# Patient Record
Sex: Male | Born: 1950 | Race: Black or African American | Hispanic: No | Smoking: Former smoker
Health system: Southern US, Community
[De-identification: ages and names within clinical notes are randomized; demographics above are authoritative.]

## PROBLEM LIST (undated history)

## (undated) ENCOUNTER — Emergency Department (HOSPITAL_BASED_OUTPATIENT_CLINIC_OR_DEPARTMENT_OTHER): Admission: EM | Payer: Medicare Other | Source: Home / Self Care

## (undated) DIAGNOSIS — M1990 Unspecified osteoarthritis, unspecified site: Secondary | ICD-10-CM

## (undated) DIAGNOSIS — M109 Gout, unspecified: Secondary | ICD-10-CM

## (undated) DIAGNOSIS — G8929 Other chronic pain: Secondary | ICD-10-CM

## (undated) DIAGNOSIS — B019 Varicella without complication: Secondary | ICD-10-CM

## (undated) DIAGNOSIS — E669 Obesity, unspecified: Secondary | ICD-10-CM

## (undated) DIAGNOSIS — J309 Allergic rhinitis, unspecified: Secondary | ICD-10-CM

## (undated) DIAGNOSIS — I10 Essential (primary) hypertension: Secondary | ICD-10-CM

## (undated) DIAGNOSIS — Z87442 Personal history of urinary calculi: Secondary | ICD-10-CM

## (undated) HISTORY — DX: Gout, unspecified: M10.9

## (undated) HISTORY — DX: Personal history of urinary calculi: Z87.442

## (undated) HISTORY — DX: Allergic rhinitis, unspecified: J30.9

## (undated) HISTORY — DX: Essential (primary) hypertension: I10

## (undated) HISTORY — DX: Obesity, unspecified: E66.9

## (undated) HISTORY — PX: CARPAL TUNNEL RELEASE: SHX101

## (undated) HISTORY — DX: Unspecified osteoarthritis, unspecified site: M19.90

## (undated) HISTORY — DX: Varicella without complication: B01.9

---

## 2012-03-20 DIAGNOSIS — M109 Gout, unspecified: Secondary | ICD-10-CM | POA: Insufficient documentation

## 2012-03-20 DIAGNOSIS — I10 Essential (primary) hypertension: Secondary | ICD-10-CM | POA: Insufficient documentation

## 2014-06-20 DIAGNOSIS — E669 Obesity, unspecified: Secondary | ICD-10-CM | POA: Insufficient documentation

## 2014-06-20 DIAGNOSIS — Z87442 Personal history of urinary calculi: Secondary | ICD-10-CM | POA: Insufficient documentation

## 2014-06-20 DIAGNOSIS — J302 Other seasonal allergic rhinitis: Secondary | ICD-10-CM | POA: Insufficient documentation

## 2015-03-13 ENCOUNTER — Ambulatory Visit: Payer: Self-pay | Admitting: Family Medicine

## 2015-05-16 ENCOUNTER — Ambulatory Visit: Payer: Self-pay | Admitting: Family Medicine

## 2015-05-20 ENCOUNTER — Encounter: Payer: Self-pay | Admitting: Family Medicine

## 2015-05-20 ENCOUNTER — Ambulatory Visit (INDEPENDENT_AMBULATORY_CARE_PROVIDER_SITE_OTHER): Payer: Medicare Other | Admitting: Family Medicine

## 2015-05-20 ENCOUNTER — Telehealth: Payer: Self-pay | Admitting: Family Medicine

## 2015-05-20 VITALS — BP 142/84 | HR 82 | Temp 98.4°F | Ht 72.25 in | Wt 235.0 lb

## 2015-05-20 DIAGNOSIS — I10 Essential (primary) hypertension: Secondary | ICD-10-CM | POA: Diagnosis not present

## 2015-05-20 DIAGNOSIS — M25561 Pain in right knee: Secondary | ICD-10-CM

## 2015-05-20 DIAGNOSIS — Z0000 Encounter for general adult medical examination without abnormal findings: Secondary | ICD-10-CM | POA: Diagnosis not present

## 2015-05-20 DIAGNOSIS — Z13220 Encounter for screening for lipoid disorders: Secondary | ICD-10-CM | POA: Diagnosis not present

## 2015-05-20 DIAGNOSIS — M25562 Pain in left knee: Secondary | ICD-10-CM

## 2015-05-20 DIAGNOSIS — M1710 Unilateral primary osteoarthritis, unspecified knee: Secondary | ICD-10-CM | POA: Insufficient documentation

## 2015-05-20 LAB — COMPREHENSIVE METABOLIC PANEL
ALK PHOS: 133 U/L — AB (ref 39–117)
ALT: 38 U/L (ref 0–53)
AST: 61 U/L — AB (ref 0–37)
Albumin: 3.9 g/dL (ref 3.5–5.2)
BILIRUBIN TOTAL: 0.7 mg/dL (ref 0.2–1.2)
BUN: 13 mg/dL (ref 6–23)
CO2: 24 mEq/L (ref 19–32)
Calcium: 9.6 mg/dL (ref 8.4–10.5)
Chloride: 102 mEq/L (ref 96–112)
Creatinine, Ser: 1.33 mg/dL (ref 0.40–1.50)
GFR: 69.31 mL/min (ref >60.00)
GLUCOSE: 97 mg/dL (ref 70–99)
Potassium: 4.6 mEq/L (ref 3.5–5.1)
SODIUM: 134 meq/L — AB (ref 135–145)
TOTAL PROTEIN: 7.7 g/dL (ref 6.0–8.3)

## 2015-05-20 LAB — LIPID PANEL
Cholesterol: 172 mg/dL (ref 0–200)
HDL: 65.6 mg/dL (ref >39.00)
LDL Cholesterol: 78 mg/dL (ref 0–99)
NONHDL: 106.75
Total CHOL/HDL Ratio: 3
Triglycerides: 144 mg/dL (ref 0.0–149.0)
VLDL: 28.8 mg/dL (ref 0.0–40.0)

## 2015-05-20 NOTE — Assessment & Plan Note (Signed)
Declines immunizations. Is in need of colonoscopy and AAA screening. He would like to wait on this at this time. Declines PSA screening. Attending labs today: Lipid and CMP.

## 2015-05-20 NOTE — Telephone Encounter (Signed)
As been added to his chart.

## 2015-05-20 NOTE — Telephone Encounter (Signed)
Pt called back to give the pharmacy is uses, [facility] [address] [id number], [place] [address] [phone]. Thank you!

## 2015-05-20 NOTE — Progress Notes (Signed)
Subjective:  Patient ID: Brian Hawkins, male    DOB: 12/21/1950  Age: 65 y.o. MRN: [id number]  CC: Establish care; Knee pain, bilateral  HPI Brian Hawkins is a 65 y.o. male presents to the clinic today to establish care.  Preventative Healthcare  Colonoscopy: Wants to wait on this.  Immunizations  Tetanus - Declines.  Pneumococcal - Declines.   Flu - Declines.   Zoster - Declines.   Prostate cancer screening: Has had screening previously. Declines at this time.  Labs: Needs Metabolic panel and Lipid panel today .  Exercise: No.   Alcohol use: See below.   Smoking/tobacco use: Former smoker. Needs AAA screening; patient elects to wait.  Regular dental exams: Dentures.  Knee pain, bilateral  Patient reports long-standing knee pain bilaterally, right greater than left.  He states is worse for the past 2 weeks after he took someone bowling.  Pain is located diffusely.  He's been taking Aleve with little improvement.  Exacerbated by physical activity.  No other complaints.  PMH, Surgical Hx, Family Hx, Social History reviewed and updated as below.  Past Medical History  Diagnosis Date  . Hypertension   . Obesity   . Gout   . History of kidney stones   . Allergic rhinitis   . Chicken pox   . Arthritis    Past Surgical History  Procedure Laterality Date  . Carpal tunnel release      Left    Family History  Problem Relation Age of Onset  . Hypertension Mother   . Hypertension Father   . Arthritis Father   . Hypertension Sister   . Hypertension Brother   . Hypertension Maternal Aunt   . Hypertension Maternal Uncle   . Hypertension Paternal Aunt   . Hypertension Paternal Uncle   . Hypertension Maternal Grandmother   . Hypertension Maternal Grandfather   . Hypertension Paternal Grandmother   . Hypertension Paternal Grandfather   . Hypertension Sister   . Hypertension Sister   . Hypertension Brother     Social History  Substance Use Topics  .  Smoking status: Former Smoker    Quit date: 05/19/2006  . Smokeless tobacco: Never Used  . Alcohol Use: 9.0 oz/week    0 Standard drinks or equivalent, 15 Glasses of wine per week   Review of Systems  Musculoskeletal:       Knee pain, bilateral.   Neurological:       Memory problems.  All other systems reviewed and are negative.  Objective:   Today's Vitals: BP 142/84 mmHg  Pulse 82  Temp(Src) 98.4 F (36.9 C) (Oral)  Ht 6' 0.25" (1.835 m)  Wt 235 lb (106.595 kg)  BMI 31.66 kg/m2  SpO2 96%  Physical Exam  Constitutional: He is oriented to person, place, and time. He appears well-developed. No distress.  HENT:  Head: Normocephalic and atraumatic.  Mouth/Throat: Oropharynx is clear and moist. No oropharyngeal exudate.  Normal TM's bilaterally.   Eyes: Conjunctivae are normal. No scleral icterus.  Neck: Neck supple.  Cardiovascular: Normal rate and regular rhythm.   2/6 systolic murmur noted.   Pulmonary/Chest: Effort normal and breath sounds normal. He has no wheezes. He has no rales.  Abdominal: Soft. He exhibits no distension. There is no tenderness. There is no rebound and no guarding.  Lymphadenopathy:    He has no cervical adenopathy.  Neurological: He is alert and oriented to person, place, and time.  Skin: Skin is warm and dry. No rash noted.  Psychiatric: He has a normal mood and affect.  Vitals reviewed.  Assessment & Plan:   Problem List Items Addressed This Visit    Essential hypertension    At goal given age. Will need labs prior to refilling medication as last BMP reveal elevated creatinine.        Relevant Medications   amLODipine-valsartan (EXFORGE) 10-320 MG tablet   Other Relevant Orders   Comp Met (CMET)   Preventative health care - Primary    Declines immunizations. Is in need of colonoscopy and AAA screening. He would like to wait on this at this time. Declines PSA screening. Attending labs today: Lipid and CMP.      Bilateral knee pain     Secondary to osteoarthritis. Advised xray and patient declined. Recommended PRN Tylenol with occasional NSAID (need to review renal function).       Other Visit Diagnoses    Screening, lipid        Relevant Orders    Lipid Profile       Outpatient Encounter Prescriptions as of 05/20/2015  Medication Sig  . amLODipine-valsartan (EXFORGE) 10-320 MG tablet Take by mouth.   No facility-administered encounter medications on file as of 05/20/2015.    Follow-up: Return in about 6 months (around 11/17/2015).  [place]

## 2015-05-20 NOTE — Patient Instructions (Addendum)
It was nice to see you today.  We will call with your lab results.  Continue your medication.  Use tylenol and/or aleve (sparingly for your knee pain).  Follow up:  Return in about 6 months (around 11/17/2015).  Take care  Dr. Adriana Simas  Health Maintenance, Male A healthy lifestyle and preventative care can promote health and wellness.  Maintain regular health, dental, and eye exams.  Eat a healthy diet. Foods like vegetables, fruits, whole grains, low-fat dairy products, and lean protein foods contain the nutrients you need and are low in calories. Decrease your intake of foods high in solid fats, added sugars, and salt. Get information about a proper diet from your health care provider, if necessary.  Regular physical exercise is one of the most important things you can do for your health. Most adults should get at least 150 minutes of moderate-intensity exercise (any activity that increases your heart rate and causes you to sweat) each week. In addition, most adults need muscle-strengthening exercises on 2 or more days a week.   Maintain a healthy weight. The body mass index (BMI) is a screening tool to identify possible weight problems. It provides an estimate of body fat based on height and weight. Your health care provider can find your BMI and can help you achieve or maintain a healthy weight. For males 20 years and older:  A BMI below 18.5 is considered underweight.  A BMI of 18.5 to 24.9 is normal.  A BMI of 25 to 29.9 is considered overweight.  A BMI of 30 and above is considered obese.  Maintain normal blood lipids and cholesterol by exercising and minimizing your intake of saturated fat. Eat a balanced diet with plenty of fruits and vegetables. Blood tests for lipids and cholesterol should begin at age 38 and be repeated every 5 years. If your lipid or cholesterol levels are high, you are over age 33, or you are at high risk for heart disease, you may need your cholesterol  levels checked more frequently.Ongoing high lipid and cholesterol levels should be treated with medicines if diet and exercise are not working.  If you smoke, find out from your health care provider how to quit. If you do not use tobacco, do not start.  Lung cancer screening is recommended for adults aged 55-80 years who are at high risk for developing lung cancer because of a history of smoking. A yearly low-dose CT scan of the lungs is recommended for people who have at least a 30-pack-year history of smoking and are current smokers or have quit within the past 15 years. A pack year of smoking is smoking an average of 1 pack of cigarettes a day for 1 year (for example, a 30-pack-year history of smoking could mean smoking 1 pack a day for 30 years or 2 packs a day for 15 years). Yearly screening should continue until the smoker has stopped smoking for at least 15 years. Yearly screening should be stopped for people who develop a health problem that would prevent them from having lung cancer treatment.  If you choose to drink alcohol, do not have more than 2 drinks per day. One drink is considered to be 12 oz (360 mL) of beer, 5 oz (150 mL) of wine, or 1.5 oz (45 mL) of liquor.  Avoid the use of street drugs. Do not share needles with anyone. Ask for help if you need support or instructions about stopping the use of drugs.  High blood pressure causes  heart disease and increases the risk of stroke. High blood pressure is more likely to develop in:  People who have blood pressure in the end of the normal range (100-139/85-89 mm Hg).  People who are overweight or obese.  People who are African American.  If you are 1-57 years of age, have your blood pressure checked every 3-5 years. If you are 18 years of age or older, have your blood pressure checked every year. You should have your blood pressure measured twice--once when you are at a hospital or clinic, and once when you are not at a hospital or  clinic. Record the average of the two measurements. To check your blood pressure when you are not at a hospital or clinic, you can use:  An automated blood pressure machine at a pharmacy.  A home blood pressure monitor.  If you are 2-23 years old, ask your health care provider if you should take aspirin to prevent heart disease.  Diabetes screening involves taking a blood sample to check your fasting blood sugar level. This should be done once every 3 years after age 15 if you are at a normal weight and without risk factors for diabetes. Testing should be considered at a younger age or be carried out more frequently if you are overweight and have at least 1 risk factor for diabetes.  Colorectal cancer can be detected and often prevented. Most routine colorectal cancer screening begins at the age of 87 and continues through age 20. However, your health care provider may recommend screening at an earlier age if you have risk factors for colon cancer. On a yearly basis, your health care provider may provide home test kits to check for hidden blood in the stool. A small camera at the end of a tube may be used to directly examine the colon (sigmoidoscopy or colonoscopy) to detect the earliest forms of colorectal cancer. Talk to your health care provider about this at age 57 when routine screening begins. A direct exam of the colon should be repeated every 5-10 years through age 28, unless early forms of precancerous polyps or small growths are found.  People who are at an increased risk for hepatitis B should be screened for this virus. You are considered at high risk for hepatitis B if:  You were born in a country where hepatitis B occurs often. Talk with your health care provider about which countries are considered high risk.  Your parents were born in a high-risk country and you have not received a shot to protect against hepatitis B (hepatitis B vaccine).  You have HIV or AIDS.  You use needles  to inject street drugs.  You live with, or have sex with, someone who has hepatitis B.  You are a man who has sex with other men (MSM).  You get hemodialysis treatment.  You take certain medicines for conditions like cancer, organ transplantation, and autoimmune conditions.  Hepatitis C blood testing is recommended for all people born from [birth year] through 1966 and any individual with known risk factors for hepatitis C.  Healthy men should no longer receive prostate-specific antigen (PSA) blood tests as part of routine cancer screening. Talk to your health care provider about prostate cancer screening.  Testicular cancer screening is not recommended for adolescents or adult males who have no symptoms. Screening includes self-exam, a health care provider exam, and other screening tests. Consult with your health care provider about any symptoms you have or any concerns you have about  testicular cancer.  Practice safe sex. Use condoms and avoid high-risk sexual practices to reduce the spread of sexually transmitted infections (STIs).  You should be screened for STIs, including gonorrhea and chlamydia if:  You are sexually active and are younger than 24 years.  You are older than 24 years, and your health care provider tells you that you are at risk for this type of infection.  Your sexual activity has changed since you were last screened, and you are at an increased risk for chlamydia or gonorrhea. Ask your health care provider if you are at risk.  If you are at risk of being infected with HIV, it is recommended that you take a prescription medicine daily to prevent HIV infection. This is called pre-exposure prophylaxis (PrEP). You are considered at risk if:  You are a man who has sex with other men (MSM).  You are a heterosexual man who is sexually active with multiple partners.  You take drugs by injection.  You are sexually active with a partner who has HIV.  Talk with your health  care provider about whether you are at high risk of being infected with HIV. If you choose to begin PrEP, you should first be tested for HIV. You should then be tested every 3 months for as long as you are taking PrEP.  Use sunscreen. Apply sunscreen liberally and repeatedly throughout the day. You should seek shade when your shadow is shorter than you. Protect yourself by wearing long sleeves, pants, a wide-brimmed hat, and sunglasses year round whenever you are outdoors.  Tell your health care provider of new moles or changes in moles, especially if there is a change in shape or color. Also, tell your health care provider if a mole is larger than the size of a pencil eraser.  A one-time screening for abdominal aortic aneurysm (AAA) and surgical repair of large AAAs by ultrasound is recommended for men aged 65-75 years who are current or former smokers.  Stay current with your vaccines (immunizations).   This information is not intended to replace advice given to you by your health care provider. Make sure you discuss any questions you have with your health care provider.   Document Released: 09/25/2007 Document Revised: 04/19/2014 Document Reviewed: 08/24/2010 Elsevier Interactive Patient Education [employer]

## 2015-05-20 NOTE — Assessment & Plan Note (Signed)
Secondary to osteoarthritis. Advised xray and patient declined. Recommended PRN Tylenol with occasional NSAID (need to review renal function).

## 2015-05-20 NOTE — Assessment & Plan Note (Signed)
At goal given age. Will need labs prior to refilling medication as last BMP reveal elevated creatinine.

## 2015-05-23 ENCOUNTER — Other Ambulatory Visit: Payer: Self-pay

## 2015-05-23 MED ORDER — AMLODIPINE BESYLATE-VALSARTAN 10-320 MG PO TABS: 1.0000 | ORAL_TABLET | Freq: Every day | ORAL | Status: DC

## 2015-05-26 ENCOUNTER — Ambulatory Visit: Payer: Self-pay | Admitting: Family Medicine

## 2015-10-15 ENCOUNTER — Telehealth: Payer: Self-pay | Admitting: *Deleted

## 2015-10-15 NOTE — Telephone Encounter (Signed)
PA completed on the phone, it was denied as patient has not tried PalauIrbestan, Irbestran/HCTZ, Losartan, or Valsartan/HCTZ.  Please advise. thanks

## 2015-10-15 NOTE — Telephone Encounter (Addendum)
[facility] has requested a prior authorization for Amlodipine Case number: [id number] Fax [phone]

## 2015-10-16 NOTE — Telephone Encounter (Signed)
I tried to call pt number and daughter number in his chart both numbers are not in service. Please advise?

## 2015-10-16 NOTE — Telephone Encounter (Signed)
I will hold until he calls then, thanks for trying.

## 2015-10-16 NOTE — Telephone Encounter (Signed)
Please have him follow up with me to discuss.

## 2015-10-16 NOTE — Telephone Encounter (Signed)
Can you schedule the patient a follow up with Dr. Adriana Simasook to discuss medications as his was denied by the insurance, thanks

## 2015-10-16 NOTE — Telephone Encounter (Signed)
Ok. NP Your welcome.

## 2015-11-17 ENCOUNTER — Ambulatory Visit: Payer: BLUE CROSS/BLUE SHIELD | Admitting: Family Medicine

## 2015-11-17 DIAGNOSIS — Z0289 Encounter for other administrative examinations: Secondary | ICD-10-CM

## 2016-07-23 ENCOUNTER — Emergency Department (HOSPITAL_BASED_OUTPATIENT_CLINIC_OR_DEPARTMENT_OTHER)
Admission: EM | Admit: 2016-07-23 | Discharge: 2016-07-23 | Disposition: A | Discharge status: Home / Self Care | Payer: Medicare HMO | Attending: Emergency Medicine | Admitting: Emergency Medicine

## 2016-07-23 ENCOUNTER — Encounter (HOSPITAL_BASED_OUTPATIENT_CLINIC_OR_DEPARTMENT_OTHER): Payer: Self-pay | Admitting: Emergency Medicine

## 2016-07-23 DIAGNOSIS — Z87891 Personal history of nicotine dependence: Secondary | ICD-10-CM | POA: Diagnosis not present

## 2016-07-23 DIAGNOSIS — I10 Essential (primary) hypertension: Secondary | ICD-10-CM | POA: Diagnosis not present

## 2016-07-23 DIAGNOSIS — M25561 Pain in right knee: Secondary | ICD-10-CM | POA: Diagnosis not present

## 2016-07-23 DIAGNOSIS — G8929 Other chronic pain: Secondary | ICD-10-CM

## 2016-07-23 DIAGNOSIS — M25562 Pain in left knee: Secondary | ICD-10-CM

## 2016-07-23 DIAGNOSIS — Z5321 Procedure and treatment not carried out due to patient leaving prior to being seen by health care provider: Secondary | ICD-10-CM | POA: Insufficient documentation

## 2016-07-23 NOTE — ED Notes (Signed)
Unable to locate pt in room, BR or hallway.

## 2016-07-23 NOTE — ED Triage Notes (Signed)
Patient states that his bilateral knees hurt - "they are wore out"

## 2018-09-05 ENCOUNTER — Ambulatory Visit: Payer: Medicare HMO | Admitting: Medical

## 2018-09-05 ENCOUNTER — Other Ambulatory Visit: Payer: Self-pay

## 2018-09-05 NOTE — Patient Instructions (Signed)
No charge. Pt never answered call and did not respond to virtual invite.

## 2018-09-05 NOTE — Progress Notes (Signed)
   Subjective:    Patient ID: Brian Hawkins, male    DOB: Jul 11, 1950, 68 y.o.   MRN: [id number]  HPI  CMA called and no answer. Sent invite and called. No answer.  Review of Systems     Objective:   Physical Exam        Assessment & Plan:

## 2019-01-25 ENCOUNTER — Emergency Department (HOSPITAL_BASED_OUTPATIENT_CLINIC_OR_DEPARTMENT_OTHER)
Admission: EM | Admit: 2019-01-25 | Discharge: 2019-01-25 | Disposition: A | Discharge status: Home / Self Care | Payer: Medicare HMO | Attending: Emergency Medicine | Admitting: Emergency Medicine

## 2019-01-25 ENCOUNTER — Other Ambulatory Visit: Payer: Self-pay

## 2019-01-25 ENCOUNTER — Encounter (HOSPITAL_BASED_OUTPATIENT_CLINIC_OR_DEPARTMENT_OTHER): Payer: Self-pay | Admitting: Emergency Medicine

## 2019-01-25 DIAGNOSIS — J019 Acute sinusitis, unspecified: Secondary | ICD-10-CM

## 2019-01-25 DIAGNOSIS — R0981 Nasal congestion: Secondary | ICD-10-CM | POA: Diagnosis present

## 2019-01-25 DIAGNOSIS — Z20828 Contact with and (suspected) exposure to other viral communicable diseases: Secondary | ICD-10-CM | POA: Diagnosis not present

## 2019-01-25 DIAGNOSIS — I10 Essential (primary) hypertension: Secondary | ICD-10-CM | POA: Diagnosis not present

## 2019-01-25 DIAGNOSIS — Z87891 Personal history of nicotine dependence: Secondary | ICD-10-CM | POA: Diagnosis not present

## 2019-01-25 MED ORDER — AMOXICILLIN-POT CLAVULANATE 875-125 MG PO TABS: 1.0000 | ORAL_TABLET | Freq: Two times a day (BID) | ORAL | 0 refills | Status: AC

## 2019-01-25 MED FILL — AMOX-CLAV 875-125 MG TABLET: 875-125 | 7 days supply | Qty: 14 | Fill #0

## 2019-01-25 NOTE — ED Notes (Signed)
Pt verbalized understanding of dc instructions.

## 2019-01-25 NOTE — ED Triage Notes (Signed)
Fever, congestion, weakness x 1 week. Neg CXR, neg COVID. Denies N/V/D. States 25lb weight loss over the past few weeks.

## 2019-01-25 NOTE — ED Provider Notes (Signed)
[facility] EMERGENCY DEPARTMENT Provider Note   CSN: [id number] Arrival date & time: 01/25/19  9509     History   Chief Complaint Chief Complaint  Patient presents with  . Fever  . Weakness    HPI Brian Hawkins is a 68 y.o. male.     HPI   Presents with concern for congestion, difficulty breathing related to congestion, has tried OTC medications, not getting enough air through sinuses.  Doesn't feel like shortness of breath.  NO OTC medications have helped.   Has felt hot, but has not checked temperature Infrequent chills Fatigue worse over the last week. A little better today. Mild nausea, no vomiting No diarrhea, no abdominal pain, no cough Slight headache, taking aspirin  No known sick contacts   ED visit 10/3 with dyspnea/pleurisy with neg COVID test, neg CT PE study Past Medical History:  Diagnosis Date  . Allergic rhinitis   . Arthritis   . Chicken pox   . Gout   . History of kidney stones   . Hypertension   . Obesity     Patient Active Problem List   Diagnosis Date Noted  . Preventative health care 05/20/2015  . Primary osteoarthritis of knee 05/20/2015  . Bilateral knee pain 05/20/2015  . H/O renal calculi 06/20/2014  . Obesity (BMI 30.0-34.9) 06/20/2014  . Allergic rhinitis, seasonal 06/20/2014  . Essential hypertension 03/20/2012  . Gout 03/20/2012    Past Surgical History:  Procedure Laterality Date  . CARPAL TUNNEL RELEASE     Left        Home Medications    Prior to Admission medications   Medication Sig Start Date End Date Taking? Authorizing Provider  amLODipine-valsartan (EXFORGE) 10-320 MG tablet Take 1 tablet by mouth daily. 05/23/15 05/22/16  Tommie Sams, DO  amoxicillin-clavulanate (AUGMENTIN) 875-125 MG tablet Take 1 tablet by mouth every 12 (twelve) hours for 7 days. 01/25/19 02/01/19  Alvira Monday, MD    Family History Family History  Problem Relation Age of Onset  . Hypertension Mother   .  Hypertension Father   . Arthritis Father   . Hypertension Sister   . Hypertension Brother   . Hypertension Maternal Aunt   . Hypertension Maternal Uncle   . Hypertension Paternal Aunt   . Hypertension Paternal Uncle   . Hypertension Maternal Grandmother   . Hypertension Maternal Grandfather   . Hypertension Paternal Grandmother   . Hypertension Paternal Grandfather   . Hypertension Sister   . Hypertension Sister   . Hypertension Brother     Social History Social History   Tobacco Use  . Smoking status: Former Smoker    Quit date: 05/19/2006    Years since quitting: 12.6  . Smokeless tobacco: Never Used  Substance Use Topics  . Alcohol use: Yes    Alcohol/week: 15.0 standard drinks    Types: 15 Glasses of wine per week  . Drug use: No     Allergies   Patient has no known allergies.   Review of Systems Review of Systems  Constitutional: Positive for appetite change (but making self eat), chills, fatigue and fever.  HENT: Positive for congestion. Negative for sore throat.   Respiratory: Negative for cough and shortness of breath.   Cardiovascular: Negative for chest pain.  Gastrointestinal: Positive for nausea. Negative for abdominal pain, diarrhea and vomiting.  Genitourinary: Negative for dysuria.  Musculoskeletal: Negative for back pain.  Skin: Negative for rash.  Neurological: Negative for headaches.  Physical Exam Updated Vital Signs BP 122/77 (BP Location: Right Arm)   Pulse 88   Temp 98.9 F (37.2 C) (Oral)   Resp 18   Ht 5\' 11"  (1.803 m)   Wt 91.6 kg   SpO2 98%   BMI 28.16 kg/m   Physical Exam Vitals signs and nursing note reviewed.  Constitutional:      General: He is not in acute distress.    Appearance: He is well-developed. He is not diaphoretic.  HENT:     Head: Normocephalic and atraumatic.     Nose: Congestion present.  Eyes:     Conjunctiva/sclera: Conjunctivae normal.  Neck:     Musculoskeletal: Normal range of motion.   Cardiovascular:     Rate and Rhythm: Normal rate and regular rhythm.     Heart sounds: Normal heart sounds. No murmur. No friction rub. No gallop.   Pulmonary:     Effort: Pulmonary effort is normal. No respiratory distress.     Breath sounds: Normal breath sounds. No wheezing or rales.  Abdominal:     General: There is no distension.     Palpations: Abdomen is soft.     Tenderness: There is no abdominal tenderness. There is no guarding.  Skin:    General: Skin is warm and dry.  Neurological:     Mental Status: He is alert and oriented to person, place, and time.      ED Treatments / Results  Labs (all labs ordered are listed, but only abnormal results are displayed) Labs Reviewed  NOVEL CORONAVIRUS, NAA (HOSP ORDER, SEND-OUT TO REF LAB; TAT 18-24 HRS)    EKG None  Radiology No results found.  Procedures Procedures (including critical care time)  Medications Ordered in ED Medications - No data to display   Initial Impression / Assessment and Plan / ED Course  I have reviewed the triage vital signs and the nursing notes.  Pertinent labs & imaging results that were available during my care of the patient were reviewed by me and considered in my medical decision making (see chart for details).        67yo male with history of erythrocytosis, hypertension presents with concern for nasal congestion, subjective fevers.  Denies chest pain, dyspnea, significant cough.  Recent negative PE study and COVID test 10/3, however congestion symptoms began after this.  Doubt pneumonia, PE, ACS.    Given subjective fever, chills, congestion for 7 days, reasonable to treat for possible bacterial sinusitis. Wrote rx for augmentin. Recommend continued supportive care.  COVID19 test repeated and recommend quarantine until results return.     Final Clinical Impressions(s) / ED Diagnoses   Final diagnoses:  Acute non-recurrent sinusitis, unspecified location    ED Discharge Orders          Ordered    amoxicillin-clavulanate (AUGMENTIN) 875-125 MG tablet  Every 12 hours     01/25/19 1004           Gareth Morgan, MD 01/25/19 2154

## 2019-01-26 LAB — NOVEL CORONAVIRUS, NAA (HOSP ORDER, SEND-OUT TO REF LAB; TAT 18-24 HRS): SARS-CoV-2, NAA: NOT DETECTED

## 2019-06-01 ENCOUNTER — Other Ambulatory Visit: Payer: Self-pay

## 2019-06-01 ENCOUNTER — Emergency Department (HOSPITAL_BASED_OUTPATIENT_CLINIC_OR_DEPARTMENT_OTHER)
Admission: EM | Admit: 2019-06-01 | Discharge: 2019-06-01 | Disposition: A | Discharge status: Home / Self Care | Payer: Medicare HMO | Attending: Emergency Medicine | Admitting: Emergency Medicine

## 2019-06-01 ENCOUNTER — Emergency Department (HOSPITAL_BASED_OUTPATIENT_CLINIC_OR_DEPARTMENT_OTHER): Payer: Medicare HMO

## 2019-06-01 ENCOUNTER — Encounter (HOSPITAL_BASED_OUTPATIENT_CLINIC_OR_DEPARTMENT_OTHER): Payer: Self-pay | Admitting: Emergency Medicine

## 2019-06-01 DIAGNOSIS — I10 Essential (primary) hypertension: Secondary | ICD-10-CM | POA: Insufficient documentation

## 2019-06-01 DIAGNOSIS — Z87891 Personal history of nicotine dependence: Secondary | ICD-10-CM | POA: Diagnosis not present

## 2019-06-01 DIAGNOSIS — M47812 Spondylosis without myelopathy or radiculopathy, cervical region: Secondary | ICD-10-CM | POA: Insufficient documentation

## 2019-06-01 DIAGNOSIS — R519 Headache, unspecified: Secondary | ICD-10-CM | POA: Diagnosis not present

## 2019-06-01 DIAGNOSIS — M542 Cervicalgia: Secondary | ICD-10-CM | POA: Diagnosis present

## 2019-06-01 MED ORDER — CYCLOBENZAPRINE HCL 10 MG PO TABS: 10.0000 mg | ORAL_TABLET | Freq: Two times a day (BID) | ORAL | 0 refills | Status: DC | PRN

## 2019-06-01 MED ORDER — HYDROCODONE-ACETAMINOPHEN 5-325 MG PO TABS: 1.0000 | ORAL_TABLET | Freq: Four times a day (QID) | ORAL | 0 refills | Status: DC | PRN

## 2019-06-01 MED FILL — HYDROCODON-APAP 5-325: 5-325 | 4 days supply | Qty: 16 | Fill #0

## 2019-06-01 MED FILL — CYCLOBENZAPRINE HCL 10 MG T: 10 | 7 days supply | Qty: 14 | Fill #0

## 2019-06-01 NOTE — ED Notes (Signed)
ED Provider at bedside. 

## 2019-06-01 NOTE — ED Provider Notes (Signed)
[facility] EMERGENCY DEPARTMENT Provider Note   CSN: [id number] Arrival date & time: 06/01/19  6440     History Chief Complaint  Patient presents with  . headache, neck pain    Brian Hawkins is a 69 y.o. male.  HPI   Patient presents to the ED for evaluation of headache and neck pain.  Patient states he had the Covid vaccination on February 4.  He feels like ever since then his symptoms have been progressing.  Patient has been having pain in the back of his head as well as his neck.  Pain is sharp and achy.  It at the base of his head and also goes towards the midline and bilateral aspects of his neck.  Turning his head increases the pain.  The muscles feel stiff.  Headache is now very severe.  He has had some numbness and neuropathy pain in his feet but that is not a new issue for him.  He denies any numbness or weakness in his upper extremities.  He has not had any trouble with his gait.  He denies any difficulty with his speech.  He has tried calling his doctors but they do not have any availability and he states he was told to come to the ED.  Past Medical History:  Diagnosis Date  . Allergic rhinitis   . Arthritis   . Chicken pox   . Gout   . History of kidney stones   . Hypertension   . Obesity     Patient Active Problem List   Diagnosis Date Noted  . Preventative health care 05/20/2015  . Primary osteoarthritis of knee 05/20/2015  . Bilateral knee pain 05/20/2015  . H/O renal calculi 06/20/2014  . Obesity (BMI 30.0-34.9) 06/20/2014  . Allergic rhinitis, seasonal 06/20/2014  . Essential hypertension 03/20/2012  . Gout 03/20/2012    Past Surgical History:  Procedure Laterality Date  . CARPAL TUNNEL RELEASE     Left       Family History  Problem Relation Age of Onset  . Hypertension Mother   . Hypertension Father   . Arthritis Father   . Hypertension Sister   . Hypertension Brother   . Hypertension Maternal Aunt   . Hypertension Maternal Uncle    . Hypertension Paternal Aunt   . Hypertension Paternal Uncle   . Hypertension Maternal Grandmother   . Hypertension Maternal Grandfather   . Hypertension Paternal Grandmother   . Hypertension Paternal Grandfather   . Hypertension Sister   . Hypertension Sister   . Hypertension Brother     Social History   Tobacco Use  . Smoking status: Former Smoker    Quit date: 05/19/2006    Years since quitting: 13.0  . Smokeless tobacco: Never Used  Substance Use Topics  . Alcohol use: Yes    Alcohol/week: 15.0 standard drinks    Types: 15 Glasses of wine per week  . Drug use: No    Home Medications Prior to Admission medications   Medication Sig Start Date End Date Taking? Authorizing Provider  amLODipine-valsartan (EXFORGE) 10-320 MG tablet Take 1 tablet by mouth daily. 05/23/15 05/22/16  Tommie Sams, DO  cyclobenzaprine (FLEXERIL) 10 MG tablet Take 1 tablet (10 mg total) by mouth 2 (two) times daily as needed (neck muscle pain). 06/01/19   Linwood Dibbles, MD  HYDROcodone-acetaminophen (NORCO/VICODIN) 5-325 MG tablet Take 1 tablet by mouth every 6 (six) hours as needed. 06/01/19   Linwood Dibbles, MD    Allergies  Patient has no known allergies.  Review of Systems   Review of Systems  All other systems reviewed and are negative.   Physical Exam Updated Vital Signs BP (!) 142/79 (BP Location: Right Arm)   Pulse 86   Temp 98.2 F (36.8 C) (Oral)   Resp 16   Ht 1.816 m (5' 11.5")   Wt 104.3 kg   SpO2 99%   BMI 31.63 kg/m   Physical Exam Vitals and nursing note reviewed.  Constitutional:      General: He is not in acute distress.    Appearance: He is well-developed.  HENT:     Head: Normocephalic and atraumatic.     Right Ear: External ear normal.     Left Ear: External ear normal.  Eyes:     General: No scleral icterus.       Right eye: No discharge.        Left eye: No discharge.     Conjunctiva/sclera: Conjunctivae normal.  Neck:     Trachea: No tracheal deviation.    Cardiovascular:     Rate and Rhythm: Normal rate and regular rhythm.  Pulmonary:     Effort: Pulmonary effort is normal. No respiratory distress.     Breath sounds: Normal breath sounds. No stridor. No wheezing or rales.  Abdominal:     General: Bowel sounds are normal. There is no distension.     Palpations: Abdomen is soft.     Tenderness: There is no abdominal tenderness. There is no guarding or rebound.  Musculoskeletal:     Cervical back: Neck supple. Spasms and tenderness present. Decreased range of motion.  Skin:    General: Skin is warm and dry.     Findings: No rash.  Neurological:     Mental Status: He is alert.     Cranial Nerves: No cranial nerve deficit (no facial droop, extraocular movements intact, no slurred speech).     Sensory: No sensory deficit.     Motor: No abnormal muscle tone or seizure activity.     Coordination: Coordination normal.     Comments: Normal strength and sensation bilateral upper and lower extremities     ED Results / Procedures / Treatments   Labs (all labs ordered are listed, but only abnormal results are displayed) Labs Reviewed - No data to display  EKG None  Radiology CT Head Wo Contrast  Result Date: 06/01/2019 CLINICAL DATA:  Subacute neuro deficits. Posterior headache since COVID vaccination 05/17/2019 EXAM: CT HEAD WITHOUT CONTRAST CT CERVICAL SPINE WITHOUT CONTRAST TECHNIQUE: Multidetector CT imaging of the head and cervical spine was performed following the standard protocol without intravenous contrast. Multiplanar CT image reconstructions of the cervical spine were also generated. COMPARISON:  None. FINDINGS: CT HEAD FINDINGS Brain: No evidence of acute infarction, hemorrhage, hydrocephalus, extra-axial collection or mass lesion/mass effect. Tubular calcified density in the interhemispheric fissure along the ACA branches. Although this is an atypical location, intracranial atherosclerotic calcification is multifocal and  heavy-especially at the siphons and V4 segments. Chronic small vessel ischemic change in the cerebral white matter. Vascular: Atherosclerotic calcification. Skull: Negative Sinuses/Orbits: Negative CT CERVICAL SPINE FINDINGS Alignment: Mild degenerative anterolisthesis at C5-6. Skull base and vertebrae: Negative for acute fracture Soft tissues and spinal canal: No evidence of inflammation or hematoma. Disc levels: C5-6 focal advanced disc degeneration. There is multilevel facet spurring with levels of subchondral erosion/irregularity. Atlantodental degeneration. Disc height loss and uncovertebral spurring causes high-grade foraminal impingement on the left at C3-4 and C4-5.  Upper chest: Negative IMPRESSION: 1. No acute intracranial or cervical spine finding. 2. Chronic small vessel ischemia and prominent atherosclerotic calcification. 3. Cervical spine degeneration, particularly advanced left-sided facet osteoarthritis and foraminal narrowing. Electronically Signed   By: Marnee Spring M.D.   On: 06/01/2019 09:32   CT Cervical Spine Wo Contrast  Result Date: 06/01/2019 CLINICAL DATA:  Subacute neuro deficits. Posterior headache since COVID vaccination 05/17/2019 EXAM: CT HEAD WITHOUT CONTRAST CT CERVICAL SPINE WITHOUT CONTRAST TECHNIQUE: Multidetector CT imaging of the head and cervical spine was performed following the standard protocol without intravenous contrast. Multiplanar CT image reconstructions of the cervical spine were also generated. COMPARISON:  None. FINDINGS: CT HEAD FINDINGS Brain: No evidence of acute infarction, hemorrhage, hydrocephalus, extra-axial collection or mass lesion/mass effect. Tubular calcified density in the interhemispheric fissure along the ACA branches. Although this is an atypical location, intracranial atherosclerotic calcification is multifocal and heavy-especially at the siphons and V4 segments. Chronic small vessel ischemic change in the cerebral white matter. Vascular:  Atherosclerotic calcification. Skull: Negative Sinuses/Orbits: Negative CT CERVICAL SPINE FINDINGS Alignment: Mild degenerative anterolisthesis at C5-6. Skull base and vertebrae: Negative for acute fracture Soft tissues and spinal canal: No evidence of inflammation or hematoma. Disc levels: C5-6 focal advanced disc degeneration. There is multilevel facet spurring with levels of subchondral erosion/irregularity. Atlantodental degeneration. Disc height loss and uncovertebral spurring causes high-grade foraminal impingement on the left at C3-4 and C4-5. Upper chest: Negative IMPRESSION: 1. No acute intracranial or cervical spine finding. 2. Chronic small vessel ischemia and prominent atherosclerotic calcification. 3. Cervical spine degeneration, particularly advanced left-sided facet osteoarthritis and foraminal narrowing. Electronically Signed   By: Marnee Spring M.D.   On: 06/01/2019 09:32    Procedures Procedures (including critical care time)  Medications Ordered in ED Medications - No data to display  ED Course  I have reviewed the triage vital signs and the nursing notes.  Pertinent labs & imaging results that were available during my care of the patient were reviewed by me and considered in my medical decision making (see chart for details).    MDM Rules/Calculators/A&P                      Patient's x-rays demonstrate significant osteoarthritis of the cervical spine.  Patient's not having any acute neurologic dysfunction.  No signs of radiculopathy at this time.  I suspect the patient's symptoms are related to the degenerative disc disease and facet osteoarthritis.  Patient is surprised by this finding as he states he is never had any trouble until he had his vaccination.  I explained to the patient that he likely had the arthritis prior to the vaccination but it was not giving him trouble previously.  I am not sure how the vaccination would have triggered this.   Will dc home with pain  meds.  Pt also asked about his neuropathy.  He is on neurontin 300 mg tid.  Pt does not think this has been helpful.  Discussed increasing doses of the meds sometimes used but pt feels like it already causes significant side effects.  Will try the pain medications for now.  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

## 2019-06-01 NOTE — ED Notes (Signed)
Pt state that he had covid shot and then got bad pain in his neck left side pt had no numbness ort ingling in upper arms  but  Wanted to make sure that shot did not cause the pain  And bad h/a

## 2019-06-01 NOTE — Discharge Instructions (Addendum)
The CT scan showed Cervical spine degeneration, particularly advanced left-sided facet osteoarthritis and foraminal narrowing.  Take the medications to help with your pain.  Follow up with your doctor or consider seeing a spine doctor if the symptoms persist to discuss further treatment options./

## 2019-06-01 NOTE — ED Triage Notes (Signed)
Pt sts he got the Covid vaccine on Feb 4th.  Two day later he started having Pain in the back of his head and his neck that is progressively getting worse.  Sts he also had a severe HA.   Pt sts he would also like something for the Neuropathy pain in his feet.  This is not new.

## 2019-08-26 ENCOUNTER — Emergency Department (HOSPITAL_BASED_OUTPATIENT_CLINIC_OR_DEPARTMENT_OTHER)
Admission: EM | Admit: 2019-08-26 | Discharge: 2019-08-26 | Disposition: A | Discharge status: Home / Self Care | Payer: Medicare HMO | Attending: Emergency Medicine | Admitting: Emergency Medicine

## 2019-08-26 ENCOUNTER — Other Ambulatory Visit: Payer: Self-pay

## 2019-08-26 ENCOUNTER — Encounter (HOSPITAL_BASED_OUTPATIENT_CLINIC_OR_DEPARTMENT_OTHER): Payer: Self-pay | Admitting: Emergency Medicine

## 2019-08-26 DIAGNOSIS — Z5321 Procedure and treatment not carried out due to patient leaving prior to being seen by health care provider: Secondary | ICD-10-CM | POA: Diagnosis not present

## 2019-08-26 DIAGNOSIS — R0789 Other chest pain: Secondary | ICD-10-CM | POA: Insufficient documentation

## 2019-08-26 NOTE — ED Notes (Signed)
Not found in lobby when called for treatment room. LWBS

## 2019-08-26 NOTE — ED Triage Notes (Signed)
Pt arrives with c/o chest pressure for 2-3 days. States he thought it was seasonal allergies and was seen at [facility] today with full cardiac workup - EKG only ordered from protocol as labs from today are visible in Epic. Pt states he has taken "everything" at home for pain - would not elaborate - and has experienced no improvement.

## 2020-01-15 ENCOUNTER — Emergency Department (HOSPITAL_BASED_OUTPATIENT_CLINIC_OR_DEPARTMENT_OTHER)
Admission: EM | Admit: 2020-01-15 | Discharge: 2020-01-15 | Disposition: A | Discharge status: Home / Self Care | Payer: Medicare HMO | Attending: Emergency Medicine | Admitting: Emergency Medicine

## 2020-01-15 ENCOUNTER — Encounter (HOSPITAL_BASED_OUTPATIENT_CLINIC_OR_DEPARTMENT_OTHER): Payer: Self-pay | Admitting: *Deleted

## 2020-01-15 ENCOUNTER — Other Ambulatory Visit: Payer: Self-pay

## 2020-01-15 DIAGNOSIS — R0981 Nasal congestion: Secondary | ICD-10-CM | POA: Diagnosis present

## 2020-01-15 DIAGNOSIS — J3489 Other specified disorders of nose and nasal sinuses: Secondary | ICD-10-CM | POA: Insufficient documentation

## 2020-01-15 DIAGNOSIS — Z20822 Contact with and (suspected) exposure to covid-19: Secondary | ICD-10-CM | POA: Insufficient documentation

## 2020-01-15 DIAGNOSIS — R0602 Shortness of breath: Secondary | ICD-10-CM | POA: Insufficient documentation

## 2020-01-15 DIAGNOSIS — Z79899 Other long term (current) drug therapy: Secondary | ICD-10-CM | POA: Insufficient documentation

## 2020-01-15 DIAGNOSIS — Z87891 Personal history of nicotine dependence: Secondary | ICD-10-CM | POA: Diagnosis not present

## 2020-01-15 DIAGNOSIS — I10 Essential (primary) hypertension: Secondary | ICD-10-CM | POA: Diagnosis not present

## 2020-01-15 LAB — RESP PANEL BY RT PCR (RSV, FLU A&B, COVID)
Influenza A by PCR: NEGATIVE
Influenza B by PCR: NEGATIVE
Respiratory Syncytial Virus by PCR: NEGATIVE
SARS Coronavirus 2 by RT PCR: NEGATIVE

## 2020-01-15 NOTE — ED Notes (Signed)
ED Provider at bedside. 

## 2020-01-15 NOTE — Discharge Instructions (Addendum)
You have been seen here for nasal congestion.  I want you to take Claritin daily for the next month.  I also want you to continue taking your nasal spray, 2 times daily for 7 days then 1 time daily for the next 3 weeks.  I also recommend Mucinex as this can also help with nasal decongestion.  Your Covid test is pending please self quarantine until you get your results back on MyChart.  If you are Covid positive you must self quarantine for 10 days starting on symptom onset.  Please contact post Covid care if you are Covid positive.  I have given the contact information for ENT please contact them as I feel you may need further evaluation of your nasal congestion.  Come back to the emergency department if you develop chest pain, shortness of breath, severe abdominal pain, uncontrolled nausea, vomiting, diarrhea.

## 2020-01-15 NOTE — ED Provider Notes (Signed)
[facility] EMERGENCY DEPARTMENT Provider Note   CSN: [id number] Arrival date & time: 01/15/20  6734     History Chief Complaint  Patient presents with  . Shortness of Breath  . Nasal congestion    Brian Hawkins is a 69 y.o. male.  HPI   Patient with significant medical history of allergic rhinitis, arthritis, gout, kidney stones, hypertension presents to emergency department with chief complaint of nasal congestion and sinus pressure x4 weeks.  Patient states he has had continuous nasal congestion and feeling pressure in his forehead as well as his cheeks.  Patient states he went to his primary care provider who placed him on amoxicillin for 7 days.  He states he did not feel better after this but does endorse that he got his flu vaccine during that time.  Patient states he has been intermittently taking allergy medication as well as nasal sprays which does not seem to help.  He is here today because he wants relief from his continuous nasal congestion.  Patient is Covid vaccinated, denies recent sick contacts, denies recent travels.  Patient denies headache, fever, chills, ear pain, sore throat, cough, chest pain, shortness of breath, abdominal pain, nausea, vomiting, diarrhea, pedal edema.  Past Medical History:  Diagnosis Date  . Allergic rhinitis   . Arthritis   . Chicken pox   . Gout   . History of kidney stones   . Hypertension   . Obesity     Patient Active Problem List   Diagnosis Date Noted  . Preventative health care 05/20/2015  . Primary osteoarthritis of knee 05/20/2015  . Bilateral knee pain 05/20/2015  . H/O renal calculi 06/20/2014  . Obesity (BMI 30.0-34.9) 06/20/2014  . Allergic rhinitis, seasonal 06/20/2014  . Essential hypertension 03/20/2012  . Gout 03/20/2012    Past Surgical History:  Procedure Laterality Date  . CARPAL TUNNEL RELEASE     Left       Family History  Problem Relation Age of Onset  . Hypertension Mother   .  Hypertension Father   . Arthritis Father   . Hypertension Sister   . Hypertension Brother   . Hypertension Maternal Aunt   . Hypertension Maternal Uncle   . Hypertension Paternal Aunt   . Hypertension Paternal Uncle   . Hypertension Maternal Grandmother   . Hypertension Maternal Grandfather   . Hypertension Paternal Grandmother   . Hypertension Paternal Grandfather   . Hypertension Sister   . Hypertension Sister   . Hypertension Brother     Social History   Tobacco Use  . Smoking status: Former Smoker    Quit date: 05/19/2006    Years since quitting: 13.6  . Smokeless tobacco: Never Used  Substance Use Topics  . Alcohol use: Yes    Alcohol/week: 15.0 standard drinks    Types: 15 Glasses of wine per week  . Drug use: No    Home Medications Prior to Admission medications   Medication Sig Start Date End Date Taking? Authorizing Provider  amLODipine-valsartan (EXFORGE) 10-320 MG tablet Take 1 tablet by mouth daily. 05/23/15 05/22/16  Tommie Sams, DO  cyclobenzaprine (FLEXERIL) 10 MG tablet Take 1 tablet (10 mg total) by mouth 2 (two) times daily as needed (neck muscle pain). 06/01/19   Linwood Dibbles, MD  HYDROcodone-acetaminophen (NORCO/VICODIN) 5-325 MG tablet Take 1 tablet by mouth every 6 (six) hours as needed. 06/01/19   Linwood Dibbles, MD    Allergies    Patient has no known allergies.  Review of Systems   Review of Systems  Constitutional: Negative for chills and fever.  HENT: Positive for rhinorrhea and sinus pressure. Negative for congestion, sore throat, tinnitus and trouble swallowing.   Eyes: Negative for visual disturbance.  Respiratory: Negative for cough and shortness of breath.   Cardiovascular: Negative for chest pain and palpitations.  Gastrointestinal: Negative for abdominal pain, diarrhea, nausea, rectal pain and vomiting.  Genitourinary: Negative for enuresis, flank pain and frequency.  Musculoskeletal: Negative for back pain and gait problem.  Skin: Negative  for rash.  Neurological: Negative for dizziness and headaches.  Hematological: Does not bruise/bleed easily.    Physical Exam Updated Vital Signs BP 124/69 (BP Location: Right Arm)   Pulse 68   Temp (!) 97.4 F (36.3 C) (Oral)   Resp 16   Ht 5\' 11"  (1.803 m)   Wt 112 kg   SpO2 100%   BMI 34.45 kg/m   Physical Exam Vitals and nursing note reviewed.  Constitutional:      General: He is not in acute distress.    Appearance: He is not ill-appearing.  HENT:     Head: Normocephalic and atraumatic.     Comments: Patient's right TM did not show any signs of infection but was retracted on exam.    Right Ear: Tympanic membrane, ear canal and external ear normal.     Left Ear: Tympanic membrane, ear canal and external ear normal.     Nose: Congestion present. No rhinorrhea.     Mouth/Throat:     Mouth: Mucous membranes are moist.     Pharynx: Oropharynx is clear. No oropharyngeal exudate or posterior oropharyngeal erythema.  Eyes:     General: No scleral icterus. Cardiovascular:     Rate and Rhythm: Normal rate and regular rhythm.     Pulses: Normal pulses.     Heart sounds: No murmur heard.  No friction rub. No gallop.   Pulmonary:     Effort: No respiratory distress.     Breath sounds: No wheezing, rhonchi or rales.  Abdominal:     General: There is no distension.     Palpations: Abdomen is soft.     Tenderness: There is no abdominal tenderness. There is no right CVA tenderness, left CVA tenderness or guarding.  Musculoskeletal:        General: No swelling.     Right lower leg: No edema.     Left lower leg: No edema.  Skin:    General: Skin is warm and dry.     Capillary Refill: Capillary refill takes less than 2 seconds.     Findings: No rash.  Neurological:     Mental Status: He is alert.  Psychiatric:        Mood and Affect: Mood normal.     ED Results / Procedures / Treatments   Labs (all labs ordered are listed, but only abnormal results are displayed) Labs  Reviewed  RESP PANEL BY RT PCR (RSV, FLU A&B, COVID)    EKG EKG Interpretation  Date/Time:  Tuesday January 15 2020 08:50:56 EDT Ventricular Rate:  71 PR Interval:    QRS Duration: 105 QT Interval:  409 QTC Calculation: 445 R Axis:   75 Text Interpretation: Sinus rhythm Confirmed by 01-29-2003 (Marianna Fuss) on 01/15/2020 9:24:19 AM   Radiology No results found.  Procedures Procedures (including critical care time)  Medications Ordered in ED Medications - No data to display  ED Course  I have reviewed the triage vital  signs and the nursing notes.  Pertinent labs & imaging results that were available during my care of the patient were reviewed by me and considered in my medical decision making (see chart for details).    MDM Rules/Calculators/A&P                          Patient presents with nasal congestion x4 weeks.  He was alert, did not appear in acute distress, vital signs reassuring.  Will order Covid swab for further evaluation.  I have low suspicion patient have to be hospitalized if Covid positive or for a URI as he has no new oxygen requirements, no signs of respiratory distress noted on exam.  Low suspicion for systemic infection or pneumonia as patient was nontoxic-appearing, vital signs reassuring, no obvious source of infection noted on exam, lung sounds were clear bilaterally.  Low suspicion for sinusitis, otitis media/externa as there is no signs infection noted in patient's ear canals, TMs were pearly white, no signs of infection.  Patient had no tenderness upon palpation along his sinuses.  Low suspicion for strep throat as patient had no erythema or edema noted on his tonsils, posterior pillars were not erythematous.  I suspect patient suffering from a viral URI.  Will defer antibiotics at this time as he was recently on antibiotics which did not appear to help.  Will encourage H2 blockers, intranasal steroids, and following up with ENT for further  evaluation.  Vital signs remained stable, no indication for hospital mission.  Patient was discussed with attending who evaluate the patient agrees assessment plan.  Patient was given at home schedule strict return precautions.  Patient verbalized he understood and agreed to plan. Final Clinical Impression(s) / ED Diagnoses Final diagnoses:  Nasal congestion    Rx / DC Orders ED Discharge Orders    None       Carroll Sage, PA-C 01/15/20 0109    Milagros Loll, MD 01/16/20 1040

## 2020-01-15 NOTE — ED Triage Notes (Signed)
Nasal congestion and shortness of breath for a week and it is getting worst.

## 2020-03-23 ENCOUNTER — Encounter (HOSPITAL_BASED_OUTPATIENT_CLINIC_OR_DEPARTMENT_OTHER): Payer: Self-pay | Admitting: Emergency Medicine

## 2020-03-23 ENCOUNTER — Other Ambulatory Visit: Payer: Self-pay

## 2020-03-23 ENCOUNTER — Emergency Department (HOSPITAL_BASED_OUTPATIENT_CLINIC_OR_DEPARTMENT_OTHER)
Admission: EM | Admit: 2020-03-23 | Discharge: 2020-03-23 | Disposition: A | Discharge status: Home / Self Care | Payer: Medicare HMO | Attending: Emergency Medicine | Admitting: Emergency Medicine

## 2020-03-23 DIAGNOSIS — Z5321 Procedure and treatment not carried out due to patient leaving prior to being seen by health care provider: Secondary | ICD-10-CM | POA: Insufficient documentation

## 2020-03-23 DIAGNOSIS — R519 Headache, unspecified: Secondary | ICD-10-CM | POA: Diagnosis not present

## 2020-03-23 DIAGNOSIS — J069 Acute upper respiratory infection, unspecified: Secondary | ICD-10-CM

## 2020-03-23 DIAGNOSIS — Z20822 Contact with and (suspected) exposure to covid-19: Secondary | ICD-10-CM | POA: Diagnosis not present

## 2020-03-23 DIAGNOSIS — R0981 Nasal congestion: Secondary | ICD-10-CM | POA: Insufficient documentation

## 2020-03-23 LAB — RESP PANEL BY RT-PCR (FLU A&B, COVID) ARPGX2
Influenza A by PCR: NEGATIVE
Influenza B by PCR: NEGATIVE
SARS Coronavirus 2 by RT PCR: NEGATIVE

## 2020-03-23 NOTE — ED Triage Notes (Signed)
Reports nasal congestion for two or three days, reports headache. Denies fever, denies sick contacts.

## 2020-03-23 NOTE — ED Notes (Signed)
Pt notified registration that he was leaving, elopement

## 2020-03-23 NOTE — ED Notes (Signed)
Pt's O2 sats 100% on room after walking from triage to room 8

## 2020-04-18 ENCOUNTER — Emergency Department (HOSPITAL_BASED_OUTPATIENT_CLINIC_OR_DEPARTMENT_OTHER)
Admission: EM | Admit: 2020-04-18 | Discharge: 2020-04-18 | Disposition: A | Discharge status: Home / Self Care | Payer: Medicare HMO | Attending: Emergency Medicine | Admitting: Emergency Medicine

## 2020-04-18 ENCOUNTER — Other Ambulatory Visit: Payer: Self-pay

## 2020-04-18 ENCOUNTER — Encounter (HOSPITAL_BASED_OUTPATIENT_CLINIC_OR_DEPARTMENT_OTHER): Payer: Self-pay | Admitting: Emergency Medicine

## 2020-04-18 DIAGNOSIS — Z20822 Contact with and (suspected) exposure to covid-19: Secondary | ICD-10-CM | POA: Diagnosis not present

## 2020-04-18 DIAGNOSIS — I10 Essential (primary) hypertension: Secondary | ICD-10-CM | POA: Diagnosis not present

## 2020-04-18 DIAGNOSIS — Z79899 Other long term (current) drug therapy: Secondary | ICD-10-CM | POA: Diagnosis not present

## 2020-04-18 DIAGNOSIS — J0100 Acute maxillary sinusitis, unspecified: Secondary | ICD-10-CM | POA: Diagnosis not present

## 2020-04-18 DIAGNOSIS — Z87891 Personal history of nicotine dependence: Secondary | ICD-10-CM | POA: Diagnosis not present

## 2020-04-18 DIAGNOSIS — R0981 Nasal congestion: Secondary | ICD-10-CM | POA: Diagnosis present

## 2020-04-18 DIAGNOSIS — J3489 Other specified disorders of nose and nasal sinuses: Secondary | ICD-10-CM | POA: Diagnosis not present

## 2020-04-18 LAB — SARS CORONAVIRUS 2 (TAT 6-24 HRS): SARS Coronavirus 2: NEGATIVE

## 2020-04-18 MED ORDER — AMOXICILLIN-POT CLAVULANATE 875-125 MG PO TABS: 1.0000 | ORAL_TABLET | Freq: Two times a day (BID) | ORAL | 0 refills | Status: AC

## 2020-04-18 NOTE — Discharge Instructions (Addendum)
Your history and exam today is consistent with a frontal and maxillary sinusitis that has not improved with home remedies.  Please take the antibiotics for the next 14 days and follow-up with a primary doctor.  Please rest and stay hydrated.  Given the ongoing pandemic, you were also tested for COVID-19, please follow-up on this result.  If any symptoms change or worsen, please return to the nearest emergency department.

## 2020-04-18 NOTE — ED Notes (Signed)
Patient reports pressure and congestion behind eyes, forehead and nasal airway, ongoing for 1 month.

## 2020-04-18 NOTE — ED Provider Notes (Signed)
[facility] EMERGENCY DEPARTMENT Provider Note   CSN: [id number] Arrival date & time: 04/18/20  6160     History Chief Complaint  Patient presents with  . head congestion    Brian Hawkins is a 70 y.o. male.  The history is provided by the patient and medical records. No language interpreter was used.  URI Presenting symptoms: congestion, facial pain and rhinorrhea   Presenting symptoms: no fatigue, no fever and no sore throat   Severity:  Severe Onset quality:  Gradual Duration:  1 month Timing:  Constant Progression:  Worsening Chronicity:  New Relieved by:  Nothing Worsened by:  Nothing Ineffective treatments:  None tried Associated symptoms: sinus pain   Associated symptoms: no headaches        Past Medical History:  Diagnosis Date  . Allergic rhinitis   . Arthritis   . Chicken pox   . Gout   . History of kidney stones   . Hypertension   . Obesity     Patient Active Problem List   Diagnosis Date Noted  . Preventative health care 05/20/2015  . Primary osteoarthritis of knee 05/20/2015  . Bilateral knee pain 05/20/2015  . H/O renal calculi 06/20/2014  . Obesity (BMI 30.0-34.9) 06/20/2014  . Allergic rhinitis, seasonal 06/20/2014  . Essential hypertension 03/20/2012  . Gout 03/20/2012    Past Surgical History:  Procedure Laterality Date  . CARPAL TUNNEL RELEASE     Left       Family History  Problem Relation Age of Onset  . Hypertension Mother   . Hypertension Father   . Arthritis Father   . Hypertension Sister   . Hypertension Brother   . Hypertension Maternal Aunt   . Hypertension Maternal Uncle   . Hypertension Paternal Aunt   . Hypertension Paternal Uncle   . Hypertension Maternal Grandmother   . Hypertension Maternal Grandfather   . Hypertension Paternal Grandmother   . Hypertension Paternal Grandfather   . Hypertension Sister   . Hypertension Sister   . Hypertension Brother     Social History   Tobacco Use  .  Smoking status: Former Smoker    Quit date: 05/19/2006    Years since quitting: 13.9  . Smokeless tobacco: Never Used  Substance Use Topics  . Alcohol use: Yes    Alcohol/week: 15.0 standard drinks    Types: 15 Glasses of wine per week  . Drug use: No    Home Medications Prior to Admission medications   Medication Sig Start Date End Date Taking? Authorizing Provider  amLODipine-valsartan (EXFORGE) 10-320 MG tablet Take 1 tablet by mouth daily. 05/23/15 05/22/16  Coral Spikes, DO  cyclobenzaprine (FLEXERIL) 10 MG tablet Take 1 tablet (10 mg total) by mouth 2 (two) times daily as needed (neck muscle pain). 06/01/19   Dorie Rank, MD  HYDROcodone-acetaminophen (NORCO/VICODIN) 5-325 MG tablet Take 1 tablet by mouth every 6 (six) hours as needed. 06/01/19   Dorie Rank, MD    Allergies    Patient has no known allergies.  Review of Systems   Review of Systems  Constitutional: Negative for chills, diaphoresis, fatigue and fever.  HENT: Positive for congestion, rhinorrhea, sinus pressure and sinus pain. Negative for facial swelling, sore throat, trouble swallowing and voice change.   Eyes: Negative for visual disturbance.  Respiratory: Negative for chest tightness and shortness of breath.   Cardiovascular: Negative for chest pain.  Gastrointestinal: Negative for abdominal pain, constipation, diarrhea, nausea and vomiting.  Musculoskeletal: Negative for back  pain.  Neurological: Negative for facial asymmetry, light-headedness, numbness and headaches.  Psychiatric/Behavioral: Negative for agitation.  All other systems reviewed and are negative.   Physical Exam Updated Vital Signs BP 126/76 (BP Location: Left Arm)   Pulse 78   Temp 98.7 F (37.1 C) (Oral)   Resp 18   Ht 5\' 11"  (1.803 m)   Wt 108.9 kg   SpO2 99%   BMI 33.47 kg/m   Physical Exam Vitals and nursing note reviewed.  Constitutional:      General: He is not in acute distress.    Appearance: He is well-developed and  well-nourished. He is not ill-appearing or toxic-appearing.  HENT:     Head: Atraumatic.      Right Ear: Tympanic membrane, ear canal and external ear normal.     Left Ear: Tympanic membrane, ear canal and external ear normal.     Nose: Congestion present.     Right Nostril: No epistaxis or septal hematoma.     Left Nostril: No epistaxis or septal hematoma.     Right Turbinates: Not swollen.     Left Turbinates: Not swollen.     Right Sinus: Maxillary sinus tenderness present.     Left Sinus: Maxillary sinus tenderness present.     Mouth/Throat:     Mouth: Mucous membranes are moist.     Pharynx: No oropharyngeal exudate or posterior oropharyngeal erythema.  Eyes:     Extraocular Movements: Extraocular movements intact.     Conjunctiva/sclera: Conjunctivae normal.     Pupils: Pupils are equal, round, and reactive to light.  Cardiovascular:     Rate and Rhythm: Normal rate and regular rhythm.     Pulses: Normal pulses.     Heart sounds: No murmur heard.   Pulmonary:     Effort: Pulmonary effort is normal. No respiratory distress.     Breath sounds: Normal breath sounds. No wheezing, rhonchi or rales.  Chest:     Chest wall: No tenderness.  Abdominal:     General: Abdomen is flat.     Palpations: Abdomen is soft.     Tenderness: There is no abdominal tenderness. There is no right CVA tenderness, left CVA tenderness, guarding or rebound.  Musculoskeletal:        General: No edema.     Cervical back: Neck supple. No tenderness.  Skin:    General: Skin is warm and dry.     Capillary Refill: Capillary refill takes less than 2 seconds.     Findings: No erythema.  Neurological:     General: No focal deficit present.     Mental Status: He is alert.  Psychiatric:        Mood and Affect: Mood and affect and mood normal.     ED Results / Procedures / Treatments   Labs (all labs ordered are listed, but only abnormal results are displayed) Labs Reviewed  SARS CORONAVIRUS 2  (TAT 6-24 HRS)    EKG None  Radiology No results found.  Procedures Procedures (including critical care time)  Medications Ordered in ED Medications - No data to display  ED Course  I have reviewed the triage vital signs and the nursing notes.  Pertinent labs & imaging results that were available during my care of the patient were reviewed by me and considered in my medical decision making (see chart for details).    MDM Rules/Calculators/A&P  Brian Hawkins is a 70 y.o. male with a past medical history significant for gout, hypertension, arthritis, and prior kidney stones who presents with congestion, sinus pain, rhinorrhea, and facial pain.  Patient reports that for the last month this has been ongoing and has not been improved with home remedies.  He denies fevers or chills and denies any congestion, cough, chest pain, shortness of breath.  He reports that he has had continued congestion and rhinorrhea and has had significant pain in his frontal sinus and his maxillary sinus.  He reports he occasionally can get out some green mucus but then it worsens again.  He has never had sinus infections before.  He denies any more posterior headache denies any neurologic deficits.  Denies any visual changes including diplopia.  He denies difficulty with eye movement.  Denies any other neurologic deficits.  Denies nausea, vomiting, constipation, or diarrhea.  He is vaccinated against COVID-19.  On exam, patient does have some tenderness in his maxillary sinuses where his discomfort comes and goes.  No focal neurologic deficits.  Lungs clear.  Oropharyngeal exam showed some congestion and drainage but otherwise no evidence of PTA or RPA.  No nasal septal hematoma seen.  Normal extraocular movements.  Pupil symmetric and reactive.  Exam otherwise unremarkable.  Suspect acute sinusitis causing his symptoms.  We will treat with antibiotics and he will also be tested for COVID-19  given the ongoing pandemic and his worsening URI symptoms.  Patient will be given Augmentin and will follow-up with PCP.  He understood return precautions and follow-up instructions and was discharged in good condition.   Final Clinical Impression(s) / ED Diagnoses Final diagnoses:  Acute non-recurrent maxillary sinusitis  Sinus pain  Congestion of nasal sinus    Rx / DC Orders ED Discharge Orders         Ordered    amoxicillin-clavulanate (AUGMENTIN) 875-125 MG tablet  Every 12 hours        04/18/20 0854          Clinical Impression: 1. Acute non-recurrent maxillary sinusitis   2. Sinus pain   3. Congestion of nasal sinus     Disposition: Discharge  Condition: Good  I have discussed the results, Dx and Tx plan with the pt(& family if present). He/she/they expressed understanding and agree(s) with the plan. Discharge instructions discussed at great length. Strict return precautions discussed and pt &/or family have verbalized understanding of the instructions. No further questions at time of discharge.    New Prescriptions   AMOXICILLIN-CLAVULANATE (AUGMENTIN) 875-125 MG TABLET    Take 1 tablet by mouth every 12 (twelve) hours for 14 days.    Follow Up: [facility] [address] [place] [address] [phone] Schedule an appointment as soon as possible for a visit       Piedad Standiford, Canary Brim, MD 04/18/20 [phone]

## 2020-04-18 NOTE — ED Triage Notes (Signed)
Reports head congestion for a month or more with no relief with otc medications.  Denies any other symptoms.

## 2020-07-06 ENCOUNTER — Emergency Department (HOSPITAL_BASED_OUTPATIENT_CLINIC_OR_DEPARTMENT_OTHER)
Admission: EM | Admit: 2020-07-06 | Discharge: 2020-07-06 | Disposition: A | Discharge status: Home / Self Care | Payer: Medicare HMO | Attending: Emergency Medicine | Admitting: Emergency Medicine

## 2020-07-06 ENCOUNTER — Other Ambulatory Visit: Payer: Self-pay

## 2020-07-06 ENCOUNTER — Emergency Department (HOSPITAL_BASED_OUTPATIENT_CLINIC_OR_DEPARTMENT_OTHER): Payer: Medicare HMO

## 2020-07-06 ENCOUNTER — Encounter (HOSPITAL_BASED_OUTPATIENT_CLINIC_OR_DEPARTMENT_OTHER): Payer: Self-pay

## 2020-07-06 DIAGNOSIS — J069 Acute upper respiratory infection, unspecified: Secondary | ICD-10-CM | POA: Diagnosis not present

## 2020-07-06 DIAGNOSIS — Z20822 Contact with and (suspected) exposure to covid-19: Secondary | ICD-10-CM | POA: Insufficient documentation

## 2020-07-06 DIAGNOSIS — Z87891 Personal history of nicotine dependence: Secondary | ICD-10-CM | POA: Diagnosis not present

## 2020-07-06 DIAGNOSIS — I10 Essential (primary) hypertension: Secondary | ICD-10-CM | POA: Diagnosis not present

## 2020-07-06 DIAGNOSIS — Z79899 Other long term (current) drug therapy: Secondary | ICD-10-CM | POA: Insufficient documentation

## 2020-07-06 DIAGNOSIS — R0981 Nasal congestion: Secondary | ICD-10-CM | POA: Diagnosis present

## 2020-07-06 LAB — SARS CORONAVIRUS 2 (TAT 6-24 HRS): SARS Coronavirus 2: NEGATIVE

## 2020-07-06 MED ORDER — DOXYCYCLINE HYCLATE 100 MG PO CAPS: 100.0000 mg | ORAL_CAPSULE | Freq: Two times a day (BID) | ORAL | 0 refills | Status: DC

## 2020-07-06 MED ORDER — DEXAMETHASONE 6 MG PO TABS
10.0000 mg | ORAL_TABLET | Freq: Once | ORAL | Status: AC
  Administered 2020-07-06: 10 mg via ORAL
  Filled 2020-07-06: qty 1

## 2020-07-06 MED ORDER — LORATADINE 10 MG PO TABS: 10.0000 mg | ORAL_TABLET | Freq: Every day | ORAL | 0 refills | Status: DC | PRN

## 2020-07-06 NOTE — ED Provider Notes (Signed)
[facility] EMERGENCY DEPARTMENT Provider Note   CSN: [id number] Arrival date & time: 07/06/20  1206     History Chief Complaint  Patient presents with  . Nasal Congestion    Brian Hawkins is a 70 y.o. male.  The history is provided by the patient.  URI Presenting symptoms: congestion   Presenting symptoms: no cough, no ear pain, no fever and no sore throat   Severity:  Mild Onset quality:  Gradual Timing:  Intermittent Progression:  Waxing and waning Chronicity:  New Relieved by:  Nothing Worsened by:  Nothing Associated symptoms: sinus pain   Associated symptoms: no arthralgias, no headaches, no myalgias, no neck pain, no swollen glands and no wheezing        Past Medical History:  Diagnosis Date  . Allergic rhinitis   . Arthritis   . Chicken pox   . Gout   . History of kidney stones   . Hypertension   . Obesity     Patient Active Problem List   Diagnosis Date Noted  . Preventative health care 05/20/2015  . Primary osteoarthritis of knee 05/20/2015  . Bilateral knee pain 05/20/2015  . H/O renal calculi 06/20/2014  . Obesity (BMI 30.0-34.9) 06/20/2014  . Allergic rhinitis, seasonal 06/20/2014  . Essential hypertension 03/20/2012  . Gout 03/20/2012    Past Surgical History:  Procedure Laterality Date  . CARPAL TUNNEL RELEASE     Left       Family History  Problem Relation Age of Onset  . Hypertension Mother   . Hypertension Father   . Arthritis Father   . Hypertension Sister   . Hypertension Brother   . Hypertension Maternal Aunt   . Hypertension Maternal Uncle   . Hypertension Paternal Aunt   . Hypertension Paternal Uncle   . Hypertension Maternal Grandmother   . Hypertension Maternal Grandfather   . Hypertension Paternal Grandmother   . Hypertension Paternal Grandfather   . Hypertension Sister   . Hypertension Sister   . Hypertension Brother     Social History   Tobacco Use  . Smoking status: Former Smoker    Quit  date: 05/19/2006    Years since quitting: 14.1  . Smokeless tobacco: Never Used  Substance Use Topics  . Alcohol use: Yes    Alcohol/week: 15.0 standard drinks    Types: 15 Glasses of wine per week  . Drug use: No    Home Medications Prior to Admission medications   Medication Sig Start Date End Date Taking? Authorizing Provider  doxycycline (VIBRAMYCIN) 100 MG capsule Take 1 capsule (100 mg total) by mouth 2 (two) times daily. 07/06/20  Yes Curatolo, Adam, DO  loratadine (CLARITIN) 10 MG tablet Take 1 tablet (10 mg total) by mouth daily as needed for up to 30 doses for allergies. 07/06/20  Yes Curatolo, Adam, DO  amLODipine-valsartan (EXFORGE) 10-320 MG tablet Take 1 tablet by mouth daily. 05/23/15 05/22/16  Tommie Sams, DO  cyclobenzaprine (FLEXERIL) 10 MG tablet Take 1 tablet (10 mg total) by mouth 2 (two) times daily as needed (neck muscle pain). 06/01/19   Linwood Dibbles, MD  HYDROcodone-acetaminophen (NORCO/VICODIN) 5-325 MG tablet Take 1 tablet by mouth every 6 (six) hours as needed. 06/01/19   Linwood Dibbles, MD    Allergies    Patient has no known allergies.  Review of Systems   Review of Systems  Constitutional: Negative for chills and fever.  HENT: Positive for congestion and sinus pain. Negative for ear pain and  sore throat.   Eyes: Negative for pain and visual disturbance.  Respiratory: Negative for cough, shortness of breath and wheezing.   Cardiovascular: Negative for chest pain and palpitations.  Gastrointestinal: Negative for abdominal pain and vomiting.  Genitourinary: Negative for dysuria and hematuria.  Musculoskeletal: Negative for arthralgias, back pain, myalgias and neck pain.  Skin: Negative for color change and rash.  Neurological: Negative for seizures, syncope and headaches.  All other systems reviewed and are negative.   Physical Exam Updated Vital Signs BP 132/76   Pulse 88   Temp 97.9 F (36.6 C) (Oral)   Resp 18   Ht 5' 11.75" (1.822 m)   Wt 99.8 kg    SpO2 100%   BMI 30.05 kg/m   Physical Exam Vitals and nursing note reviewed.  Constitutional:      General: He is not in acute distress.    Appearance: He is well-developed. He is not ill-appearing.  HENT:     Head: Normocephalic and atraumatic.     Nose: Congestion present.     Mouth/Throat:     Mouth: Mucous membranes are moist.  Eyes:     Extraocular Movements: Extraocular movements intact.     Conjunctiva/sclera: Conjunctivae normal.     Pupils: Pupils are equal, round, and reactive to light.  Cardiovascular:     Rate and Rhythm: Normal rate and regular rhythm.     Pulses: Normal pulses.     Heart sounds: Normal heart sounds. No murmur heard.   Pulmonary:     Effort: Pulmonary effort is normal. No respiratory distress.     Breath sounds: Normal breath sounds.  Abdominal:     General: Abdomen is flat.     Palpations: Abdomen is soft.     Tenderness: There is no abdominal tenderness.  Musculoskeletal:     Cervical back: Neck supple.  Skin:    General: Skin is warm and dry.     Capillary Refill: Capillary refill takes less than 2 seconds.  Neurological:     General: No focal deficit present.     Mental Status: He is alert.     ED Results / Procedures / Treatments   Labs (all labs ordered are listed, but only abnormal results are displayed) Labs Reviewed  SARS CORONAVIRUS 2 (TAT 6-24 HRS)    EKG None  Radiology DG Chest Portable 1 View  Result Date: 07/06/2020 CLINICAL DATA:  Cough EXAM: PORTABLE CHEST 1 VIEW COMPARISON:  08/26/2019 FINDINGS: Low lung volumes. Mild left lower lobe opacity, atelectasis versus pneumonia. No pleural effusion or pneumothorax. Heart is normal in size. IMPRESSION: Low lung volumes. Mild left lower lobe opacity, atelectasis versus pneumonia. Electronically Signed   By: Charline Bills M.D.   On: 07/06/2020 12:55    Procedures Procedures   Medications Ordered in ED Medications  dexamethasone (DECADRON) tablet 10 mg (10 mg Oral  Given 07/06/20 1229)    ED Course  I have reviewed the triage vital signs and the nursing notes.  Pertinent labs & imaging results that were available during my care of the patient were reviewed by me and considered in my medical decision making (see chart for details).    MDM Rules/Calculators/A&P                          Brian Hawkins is here with nasal congestion may be cough.  Has been for several weeks.  Over-the-counter allergy medications have not helped much.  Normal vitals.  No fever.  Chest x-ray possibly with pneumonia.  Does not really have sputum production but will treat with doxycycline.  We will also give him a dose of steroid and prescribed Claritin.  No concern for sepsis.  Overall he appears very well.  Suspect that this is likely allergies.  We will have him follow-up with primary care doctor.  Discharged from the ED in good condition.  Understands return precautions.  This chart was dictated using voice recognition software.  Despite best efforts to proofread,  errors can occur which can change the documentation meaning.    Final Clinical Impression(s) / ED Diagnoses Final diagnoses:  Nasal congestion  Viral URI with cough    Rx / DC Orders ED Discharge Orders         Ordered    loratadine (CLARITIN) 10 MG tablet  Daily PRN        07/06/20 1223    doxycycline (VIBRAMYCIN) 100 MG capsule  2 times daily        07/06/20 1321           Curatolo, Adam, DO 07/06/20 1325

## 2020-07-06 NOTE — Discharge Instructions (Addendum)
Overall suspect you have allergies.  You have been tested for Covid.  Follow-up your results online tomorrow on your MyChart.  Take Claritin as needed for runny nose and allergy symptoms.  You have also been prescribed antibiotic in case there is a lung infection developing.

## 2020-07-06 NOTE — ED Triage Notes (Addendum)
Pt has a cold for "about 7-8 weeks" and it just got worse. Now has stuffy nose. Took some over the counter medication with no relief. States he has seasonal allergies but it normally goes away.

## 2020-07-08 ENCOUNTER — Emergency Department (HOSPITAL_BASED_OUTPATIENT_CLINIC_OR_DEPARTMENT_OTHER): Payer: Medicare HMO

## 2020-07-08 ENCOUNTER — Encounter (HOSPITAL_BASED_OUTPATIENT_CLINIC_OR_DEPARTMENT_OTHER): Payer: Self-pay | Admitting: Emergency Medicine

## 2020-07-08 ENCOUNTER — Emergency Department (HOSPITAL_BASED_OUTPATIENT_CLINIC_OR_DEPARTMENT_OTHER)
Admission: EM | Admit: 2020-07-08 | Discharge: 2020-07-08 | Disposition: A | Discharge status: Home / Self Care | Payer: Medicare HMO | Attending: Emergency Medicine | Admitting: Emergency Medicine

## 2020-07-08 ENCOUNTER — Other Ambulatory Visit: Payer: Self-pay

## 2020-07-08 DIAGNOSIS — R42 Dizziness and giddiness: Secondary | ICD-10-CM | POA: Diagnosis not present

## 2020-07-08 DIAGNOSIS — J069 Acute upper respiratory infection, unspecified: Secondary | ICD-10-CM | POA: Diagnosis not present

## 2020-07-08 DIAGNOSIS — I10 Essential (primary) hypertension: Secondary | ICD-10-CM | POA: Diagnosis not present

## 2020-07-08 DIAGNOSIS — Z79899 Other long term (current) drug therapy: Secondary | ICD-10-CM | POA: Diagnosis not present

## 2020-07-08 DIAGNOSIS — Z87891 Personal history of nicotine dependence: Secondary | ICD-10-CM | POA: Insufficient documentation

## 2020-07-08 LAB — CBC WITH DIFFERENTIAL/PLATELET
Abs Immature Granulocytes: 0.01 10*3/uL (ref 0.00–0.07)
Basophils Absolute: 0.1 10*3/uL (ref 0.0–0.1)
Basophils Relative: 1 %
Eosinophils Absolute: 0.1 10*3/uL (ref 0.0–0.5)
Eosinophils Relative: 2 %
HCT: 46.7 % (ref 39.0–52.0)
Hemoglobin: 16.6 g/dL (ref 13.0–17.0)
Immature Granulocytes: 0 %
Lymphocytes Relative: 27 %
Lymphs Abs: 1.1 10*3/uL (ref 0.7–4.0)
MCH: 31.7 pg (ref 26.0–34.0)
MCHC: 35.5 g/dL (ref 30.0–36.0)
MCV: 89.1 fL (ref 80.0–100.0)
Monocytes Absolute: 0.7 10*3/uL (ref 0.1–1.0)
Monocytes Relative: 17 %
Neutro Abs: 2.2 10*3/uL (ref 1.7–7.7)
Neutrophils Relative %: 53 %
Platelets: 266 10*3/uL (ref 150–400)
RBC: 5.24 MIL/uL (ref 4.22–5.81)
RDW: 13.9 % (ref 11.5–15.5)
WBC: 4.1 10*3/uL (ref 4.0–10.5)
nRBC: 0 % (ref 0.0–0.2)

## 2020-07-08 LAB — URINALYSIS, ROUTINE W REFLEX MICROSCOPIC
Bilirubin Urine: NEGATIVE
Glucose, UA: NEGATIVE mg/dL
Hgb urine dipstick: NEGATIVE
Ketones, ur: NEGATIVE mg/dL
Leukocytes,Ua: NEGATIVE
Nitrite: NEGATIVE
Protein, ur: NEGATIVE mg/dL
Specific Gravity, Urine: 1.025 (ref 1.005–1.030)
pH: 5 (ref 5.0–8.0)

## 2020-07-08 LAB — BASIC METABOLIC PANEL
Anion gap: 14 (ref 5–15)
BUN: 25 mg/dL — ABNORMAL HIGH (ref 8–23)
CO2: 18 mmol/L — ABNORMAL LOW (ref 22–32)
Calcium: 8.6 mg/dL — ABNORMAL LOW (ref 8.9–10.3)
Chloride: 96 mmol/L — ABNORMAL LOW (ref 98–111)
Creatinine, Ser: 2.04 mg/dL — ABNORMAL HIGH (ref 0.61–1.24)
GFR, Estimated: 34 mL/min — ABNORMAL LOW (ref >60)
Glucose, Bld: 75 mg/dL (ref 70–99)
Potassium: 3.8 mmol/L (ref 3.5–5.1)
Sodium: 128 mmol/L — ABNORMAL LOW (ref 135–145)

## 2020-07-08 LAB — CBG MONITORING, ED: Glucose-Capillary: 77 mg/dL (ref 70–99)

## 2020-07-08 MED ORDER — SODIUM CHLORIDE 0.9 % IV BOLUS
1000.0000 mL | Freq: Once | INTRAVENOUS | Status: AC
  Administered 2020-07-08: 1000 mL via INTRAVENOUS

## 2020-07-08 NOTE — ED Provider Notes (Signed)
[facility] EMERGENCY DEPARTMENT Provider Note   CSN: [id number] Arrival date & time: 07/08/20  1228     History Chief Complaint  Patient presents with  . Dizziness    Brian Hawkins is a 70 y.o. male.  The history is provided by the patient.  Dizziness Quality:  Lightheadedness Severity:  Mild Onset quality:  Gradual Timing:  Intermittent Progression:  Waxing and waning Chronicity:  New Context: standing up   Relieved by:  Nothing Worsened by:  Being still Associated symptoms: no blood in stool, no chest pain, no diarrhea, no headaches, no hearing loss, no nausea, no palpitations, no shortness of breath, no syncope, no tinnitus, no vision changes, no vomiting and no weakness   Risk factors: no hx of vertigo        Past Medical History:  Diagnosis Date  . Allergic rhinitis   . Arthritis   . Chicken pox   . Gout   . History of kidney stones   . Hypertension   . Obesity     Patient Active Problem List   Diagnosis Date Noted  . Preventative health care 05/20/2015  . Primary osteoarthritis of knee 05/20/2015  . Bilateral knee pain 05/20/2015  . H/O renal calculi 06/20/2014  . Obesity (BMI 30.0-34.9) 06/20/2014  . Allergic rhinitis, seasonal 06/20/2014  . Essential hypertension 03/20/2012  . Gout 03/20/2012    Past Surgical History:  Procedure Laterality Date  . CARPAL TUNNEL RELEASE     Left       Family History  Problem Relation Age of Onset  . Hypertension Mother   . Hypertension Father   . Arthritis Father   . Hypertension Sister   . Hypertension Brother   . Hypertension Maternal Aunt   . Hypertension Maternal Uncle   . Hypertension Paternal Aunt   . Hypertension Paternal Uncle   . Hypertension Maternal Grandmother   . Hypertension Maternal Grandfather   . Hypertension Paternal Grandmother   . Hypertension Paternal Grandfather   . Hypertension Sister   . Hypertension Sister   . Hypertension Brother     Social History    Tobacco Use  . Smoking status: Former Smoker    Quit date: 05/19/2006    Years since quitting: 14.1  . Smokeless tobacco: Never Used  Substance Use Topics  . Alcohol use: Yes    Alcohol/week: 15.0 standard drinks    Types: 15 Glasses of wine per week  . Drug use: No    Home Medications Prior to Admission medications   Medication Sig Start Date End Date Taking? Authorizing Provider  doxycycline (VIBRAMYCIN) 100 MG capsule Take 1 capsule (100 mg total) by mouth 2 (two) times daily. 07/06/20  Yes Crosby Oriordan, DO  amLODipine-valsartan (EXFORGE) 10-320 MG tablet Take 1 tablet by mouth daily. 05/23/15 05/22/16  Tommie Sams, DO  cyclobenzaprine (FLEXERIL) 10 MG tablet Take 1 tablet (10 mg total) by mouth 2 (two) times daily as needed (neck muscle pain). 06/01/19   Linwood Dibbles, MD  HYDROcodone-acetaminophen (NORCO/VICODIN) 5-325 MG tablet Take 1 tablet by mouth every 6 (six) hours as needed. 06/01/19   Linwood Dibbles, MD  loratadine (CLARITIN) 10 MG tablet Take 1 tablet (10 mg total) by mouth daily as needed for up to 30 doses for allergies. 07/06/20   Virgina Norfolk, DO    Allergies    Patient has no known allergies.  Review of Systems   Review of Systems  Constitutional: Negative for chills and fever.  HENT: Positive for  sinus pressure. Negative for ear pain, hearing loss, sore throat and tinnitus.   Eyes: Negative for pain and visual disturbance.  Respiratory: Positive for cough. Negative for shortness of breath.   Cardiovascular: Negative for chest pain, palpitations and syncope.  Gastrointestinal: Negative for abdominal pain, blood in stool, diarrhea, nausea and vomiting.  Genitourinary: Negative for dysuria and hematuria.  Musculoskeletal: Negative for arthralgias and back pain.  Skin: Negative for color change and rash.  Neurological: Positive for dizziness. Negative for tremors, seizures, syncope, facial asymmetry, speech difficulty, weakness, light-headedness, numbness and headaches.   All other systems reviewed and are negative.   Physical Exam Updated Vital Signs BP (!) 154/89 (BP Location: Right Arm)   Pulse 75   Temp 98.5 F (36.9 C) (Oral)   Resp 17   Ht 5' 11.75" (1.822 m)   Wt 99.8 kg   SpO2 100%   BMI 30.05 kg/m   Physical Exam Vitals and nursing note reviewed.  Constitutional:      General: He is not in acute distress.    Appearance: He is well-developed. He is not ill-appearing.  HENT:     Head: Normocephalic and atraumatic.     Nose: Nose normal.     Mouth/Throat:     Mouth: Mucous membranes are moist.  Eyes:     Extraocular Movements: Extraocular movements intact.     Conjunctiva/sclera: Conjunctivae normal.     Pupils: Pupils are equal, round, and reactive to light.  Cardiovascular:     Rate and Rhythm: Normal rate and regular rhythm.     Pulses: Normal pulses.     Heart sounds: Normal heart sounds. No murmur heard.   Pulmonary:     Effort: Pulmonary effort is normal. No respiratory distress.     Breath sounds: Normal breath sounds.  Abdominal:     Palpations: Abdomen is soft.     Tenderness: There is no abdominal tenderness.  Musculoskeletal:     Cervical back: Normal range of motion and neck supple.  Skin:    General: Skin is warm and dry.     Capillary Refill: Capillary refill takes less than 2 seconds.  Neurological:     General: No focal deficit present.     Mental Status: He is alert.     Cranial Nerves: No cranial nerve deficit.     Sensory: No sensory deficit.     Motor: No weakness.     Coordination: Coordination normal.     Comments: 5+ out of 5 strength all, normal sensation, no drift, normal finger-nose-finger     ED Results / Procedures / Treatments   Labs (all labs ordered are listed, but only abnormal results are displayed) Labs Reviewed  BASIC METABOLIC PANEL - Abnormal; Notable for the following components:      Result Value   Sodium 128 (*)    Chloride 96 (*)    CO2 18 (*)    BUN 25 (*)     Creatinine, Ser 2.04 (*)    Calcium 8.6 (*)    GFR, Estimated 34 (*)    All other components within normal limits  CBC WITH DIFFERENTIAL/PLATELET  URINALYSIS, ROUTINE W REFLEX MICROSCOPIC  CBG MONITORING, ED    EKG EKG Interpretation  Date/Time:  Tuesday July 08 2020 12:39:12 EDT Ventricular Rate:  84 PR Interval:  171 QRS Duration: 109 QT Interval:  389 QTC Calculation: 460 R Axis:   84 Text Interpretation: Sinus rhythm Borderline right axis deviation Confirmed by Lockie Mola, Exodus Kutzer ([id number]) on  07/08/2020 12:52:32 PM Also confirmed by Virgina Norfolk [phone]), editor Elita Quick [phone])  on 07/09/2020 9:36:16 AM   Radiology DG Chest 2 View  Result Date: 07/08/2020 CLINICAL DATA:  Cough. EXAM: CHEST - 2 VIEW COMPARISON:  Chest x-ray dated July 06, 2020. FINDINGS: The heart size and mediastinal contours are within normal limits. Both lungs are clear. The visualized skeletal structures are unremarkable. IMPRESSION: No active cardiopulmonary disease. Electronically Signed   By: Obie Dredge M.D.   On: 07/08/2020 13:45   CT Head Wo Contrast  Result Date: 07/08/2020 CLINICAL DATA:  Dizziness after fall. EXAM: CT HEAD WITHOUT CONTRAST TECHNIQUE: Contiguous axial images were obtained from the base of the skull through the vertex without intravenous contrast. COMPARISON:  June 01, 2019. FINDINGS: Brain: Mild chronic ischemic white matter disease is noted. No mass effect or midline shift is noted. Ventricular size is within normal limits. There is no evidence of mass lesion, hemorrhage or acute infarction. Vascular: No hyperdense vessel or unexpected calcification. Skull: Normal. Negative for fracture or focal lesion. Sinuses/Orbits: No acute finding. Other: None. IMPRESSION: Mild chronic ischemic white matter disease. No acute intracranial abnormality seen. Electronically Signed   By: Lupita Raider M.D.   On: 07/08/2020 14:23    Procedures Procedures   Medications Ordered in  ED Medications  sodium chloride 0.9 % bolus 1,000 mL (0 mLs Intravenous Stopped 07/08/20 1438)    ED Course  I have reviewed the triage vital signs and the nursing notes.  Pertinent labs & imaging results that were available during my care of the patient were reviewed by me and considered in my medical decision making (see chart for details).    MDM Rules/Calculators/A&P                          Johnathin Mittelstaedt is a 71 year old male with history of hypertension.  Overall unremarkable vitals.  Patient was seen by me several days ago and has been on antibiotics for possible pneumonia.  Covid test was negative.  Has had stuffy nose, cough.  Still no fever.  Ongoing stuffy nose.  But noticed some may be difficulty with focusing and dizziness today.  Mostly happened on the way in today.  Neurologically he is intact.  Seems to be positional.  May be a peripheral vertigo.  However possibly secondary to URI.  Had a fall recently as well and possibly some concussion type symptoms.  Will get head CT, basic labs.  Will give fluid bolus.  Has only taken 2 doses of antibiotics thus far.  Patient just remembered that he put on his wrong eyeglasses today and likely the reason why he is having trouble focusing with his eyes.  Lab work overall unremarkable.  Creatinine last year was around 1.8.  Today creatinine is 2.  No significant anemia.  Sodium around baseline as well.  Head CT showed no head bleed.  Chest x-ray with no obvious worsening pneumonia.  Overall suspect URI symptoms causing his dizziness and cough and shortness of breath.  We will have him finish course of antibiotics.  Trouble focusing and issues with vision today likely secondary to wearing the wrong glasses.  No concern for stroke.  Discharged in good condition.  Understands return precautions.  This chart was dictated using voice recognition software.  Despite best efforts to proofread,  errors can occur which can change the documentation  meaning.    Final Clinical Impression(s) / ED Diagnoses Final diagnoses:  Upper respiratory tract infection, unspecified type  Dizziness    Rx / DC Orders ED Discharge Orders    None       Virgina NorfolkCuratolo, Sloan Takagi, DO 07/10/20 1002

## 2020-07-08 NOTE — ED Triage Notes (Signed)
Seen recently for URI.  Started on antibiotics.  Here today for c/o dizziness and lightheadedness and blurred vision for several months that is gradually getting worse.

## 2020-07-08 NOTE — ED Notes (Signed)
While in patient room states "I know why my vision is blurry, I put on my old glasses".

## 2021-04-14 ENCOUNTER — Ambulatory Visit: Payer: Medicare HMO | Admitting: Medical

## 2021-07-27 ENCOUNTER — Other Ambulatory Visit: Payer: Self-pay

## 2021-07-27 ENCOUNTER — Other Ambulatory Visit (HOSPITAL_BASED_OUTPATIENT_CLINIC_OR_DEPARTMENT_OTHER): Payer: Self-pay

## 2021-07-27 ENCOUNTER — Emergency Department (HOSPITAL_BASED_OUTPATIENT_CLINIC_OR_DEPARTMENT_OTHER)
Admission: EM | Admit: 2021-07-27 | Discharge: 2021-07-27 | Disposition: A | Discharge status: Home / Self Care | Payer: Medicare Other | Attending: Emergency Medicine | Admitting: Emergency Medicine

## 2021-07-27 ENCOUNTER — Encounter (HOSPITAL_BASED_OUTPATIENT_CLINIC_OR_DEPARTMENT_OTHER): Payer: Self-pay

## 2021-07-27 DIAGNOSIS — M79672 Pain in left foot: Secondary | ICD-10-CM | POA: Diagnosis not present

## 2021-07-27 DIAGNOSIS — M25561 Pain in right knee: Secondary | ICD-10-CM | POA: Diagnosis not present

## 2021-07-27 DIAGNOSIS — M25562 Pain in left knee: Secondary | ICD-10-CM | POA: Insufficient documentation

## 2021-07-27 DIAGNOSIS — R251 Tremor, unspecified: Secondary | ICD-10-CM | POA: Diagnosis present

## 2021-07-27 DIAGNOSIS — G8929 Other chronic pain: Secondary | ICD-10-CM | POA: Diagnosis not present

## 2021-07-27 DIAGNOSIS — M79671 Pain in right foot: Secondary | ICD-10-CM | POA: Diagnosis not present

## 2021-07-27 LAB — CBC WITH DIFFERENTIAL/PLATELET
Abs Immature Granulocytes: 0.01 10*3/uL (ref 0.00–0.07)
Basophils Absolute: 0.1 10*3/uL (ref 0.0–0.1)
Basophils Relative: 1 %
Eosinophils Absolute: 0.5 10*3/uL (ref 0.0–0.5)
Eosinophils Relative: 12 %
HCT: 50.4 % (ref 39.0–52.0)
Hemoglobin: 17.1 g/dL — ABNORMAL HIGH (ref 13.0–17.0)
Immature Granulocytes: 0 %
Lymphocytes Relative: 31 %
Lymphs Abs: 1.1 10*3/uL (ref 0.7–4.0)
MCH: 30.5 pg (ref 26.0–34.0)
MCHC: 33.9 g/dL (ref 30.0–36.0)
MCV: 89.8 fL (ref 80.0–100.0)
Monocytes Absolute: 0.6 10*3/uL (ref 0.1–1.0)
Monocytes Relative: 15 %
Neutro Abs: 1.5 10*3/uL — ABNORMAL LOW (ref 1.7–7.7)
Neutrophils Relative %: 41 %
Platelets: 219 10*3/uL (ref 150–400)
RBC: 5.61 MIL/uL (ref 4.22–5.81)
RDW: 13.8 % (ref 11.5–15.5)
WBC: 3.6 10*3/uL — ABNORMAL LOW (ref 4.0–10.5)
nRBC: 0 % (ref 0.0–0.2)

## 2021-07-27 LAB — COMPREHENSIVE METABOLIC PANEL
ALT: 9 U/L (ref 0–44)
AST: 18 U/L (ref 15–41)
Albumin: 3.5 g/dL (ref 3.5–5.0)
Alkaline Phosphatase: 116 U/L (ref 38–126)
Anion gap: 11 (ref 5–15)
BUN: 17 mg/dL (ref 8–23)
CO2: 21 mmol/L — ABNORMAL LOW (ref 22–32)
Calcium: 9.2 mg/dL (ref 8.9–10.3)
Chloride: 106 mmol/L (ref 98–111)
Creatinine, Ser: 1.41 mg/dL — ABNORMAL HIGH (ref 0.61–1.24)
GFR, Estimated: 53 mL/min — ABNORMAL LOW (ref >60)
Glucose, Bld: 112 mg/dL — ABNORMAL HIGH (ref 70–99)
Potassium: 3.5 mmol/L (ref 3.5–5.1)
Sodium: 138 mmol/L (ref 135–145)
Total Bilirubin: 0.9 mg/dL (ref 0.3–1.2)
Total Protein: 8.4 g/dL — ABNORMAL HIGH (ref 6.5–8.1)

## 2021-07-27 LAB — CBG MONITORING, ED: Glucose-Capillary: 118 mg/dL — ABNORMAL HIGH (ref 70–99)

## 2021-07-27 LAB — ETHANOL: Alcohol, Ethyl (B): <10 mg/dL (ref <10)

## 2021-07-27 MED ORDER — TRAMADOL HCL 50 MG PO TABS
50.0000 mg | ORAL_TABLET | Freq: Two times a day (BID) | ORAL | 0 refills | Status: DC | PRN
  Filled 2021-07-27: qty 10, 5d supply, fill #0

## 2021-07-27 NOTE — ED Provider Notes (Addendum)
?[facility] EMERGENCY DEPARTMENT ?Provider Note ? ? ?CSN: [id number] ?Arrival date & time: 07/27/21  1305 ? ?  ? ?History ? ?Chief Complaint  ?Patient presents with  ? Tremors  ? ? ?Brian Hawkins is a 71 y.o. male. ? ?Patient here with ongoing bilateral knee and foot pain.  Ongoing occasional tremors.  Denies any fevers or chills.  History of alcohol abuse, memory loss, chronic knee and foot pain.  States that discomfort was slightly worse today.  May be increase activity.  Having some tremors on the way here but now improved.  He denies any recent alcohol use.  He denies any chest pain, shortness of breath, abdominal pain.  He has not followed up with his primary care doctor for these issues after being seen with them in the past.  Overall he denies any stroke symptoms. ? ?The history is provided by the patient.  ? ?  ? ?Home Medications ?Prior to Admission medications   ?Medication Sig Start Date End Date Taking? Authorizing Provider  ?amLODipine-valsartan (EXFORGE) 10-320 MG tablet Take 1 tablet by mouth daily. 05/23/15 05/22/16  Tommie Sams, DO  ?cyclobenzaprine (FLEXERIL) 10 MG tablet Take 1 tablet (10 mg total) by mouth 2 (two) times daily as needed (neck muscle pain). 06/01/19   Linwood Dibbles, MD  ?doxycycline (VIBRAMYCIN) 100 MG capsule Take 1 capsule (100 mg total) by mouth 2 (two) times daily. 07/06/20   Virgina Norfolk, DO  ?HYDROcodone-acetaminophen (NORCO/VICODIN) 5-325 MG tablet Take 1 tablet by mouth every 6 (six) hours as needed. 06/01/19   Linwood Dibbles, MD  ?loratadine (CLARITIN) 10 MG tablet Take 1 tablet (10 mg total) by mouth daily as needed for up to 30 doses for allergies. 07/06/20   Virgina Norfolk, DO  ?   ? ?Allergies    ?Gabapentin   ? ?Review of Systems   ?Review of Systems ? ?Physical Exam ?Updated Vital Signs ?BP (!) 162/112 (BP Location: Right Arm)   Pulse 91   Temp 98.2 ?F (36.8 ?C) (Oral)   Resp 18   Ht 5\' 11"  (1.803 m)   Wt 97.5 kg   SpO2 99%   BMI 29.99 kg/m?  ?Physical  Exam ?Vitals and nursing note reviewed.  ?Constitutional:   ?   General: He is not in acute distress. ?   Appearance: He is well-developed. He is not ill-appearing.  ?HENT:  ?   Head: Normocephalic and atraumatic.  ?   Nose: Nose normal.  ?Eyes:  ?   Extraocular Movements: Extraocular movements intact.  ?   Conjunctiva/sclera: Conjunctivae normal.  ?   Pupils: Pupils are equal, round, and reactive to light.  ?Cardiovascular:  ?   Rate and Rhythm: Normal rate and regular rhythm.  ?   Pulses: Normal pulses.  ?   Heart sounds: Normal heart sounds. No murmur heard. ?Pulmonary:  ?   Effort: Pulmonary effort is normal. No respiratory distress.  ?   Breath sounds: Normal breath sounds.  ?Abdominal:  ?   Palpations: Abdomen is soft.  ?   Tenderness: There is no abdominal tenderness.  ?Musculoskeletal:     ?   General: No swelling.  ?   Cervical back: Normal range of motion and neck supple.  ?Skin: ?   General: Skin is warm and dry.  ?   Capillary Refill: Capillary refill takes less than 2 seconds.  ?Neurological:  ?   General: No focal deficit present.  ?   Mental Status: He is alert.  ?  Sensory: No sensory deficit.  ?   Motor: No weakness.  ?   Comments: 5+ out of 5 strength throughout, normal sensation, no drift, normal finger-nose-finger, no evidence of tremors  ?Psychiatric:     ?   Mood and Affect: Mood normal.  ? ? ?ED Results / Procedures / Treatments   ?Labs ?(all labs ordered are listed, but only abnormal results are displayed) ?Labs Reviewed  ?CBC WITH DIFFERENTIAL/PLATELET - Abnormal; Notable for the following components:  ?    Result Value  ? WBC 3.6 (*)   ? Hemoglobin 17.1 (*)   ? Neutro Abs 1.5 (*)   ? All other components within normal limits  ?COMPREHENSIVE METABOLIC PANEL - Abnormal; Notable for the following components:  ? CO2 21 (*)   ? Glucose, Bld 112 (*)   ? Creatinine, Ser 1.41 (*)   ? Total Protein 8.4 (*)   ? GFR, Estimated 53 (*)   ? All other components within normal limits  ?CBG MONITORING, ED  - Abnormal; Notable for the following components:  ? Glucose-Capillary 118 (*)   ? All other components within normal limits  ?ETHANOL  ? ? ?EKG ?EKG Interpretation ? ?Date/Time:  Monday July 27 2021 13:35:34 EDT ?Ventricular Rate:  80 ?PR Interval:  166 ?QRS Duration: 90 ?QT Interval:  394 ?QTC Calculation: 454 ?R Axis:   30 ?Text Interpretation: Normal sinus rhythm Normal ECG When compared with ECG of 08-Jul-2020 12:39, PREVIOUS ECG IS PRESENT Confirmed by Virgina Norfolkuratolo, Aris Even [phone]) on 07/27/2021 1:39:24 PM ? ?Radiology ?No results found. ? ?Procedures ?Procedures  ? ? ?Medications Ordered in ED ?Medications - No data to display ? ?ED Course/ Medical Decision Making/ A&P ?  ?                        ?Medical Decision Making ?Amount and/or Complexity of Data Reviewed ?Labs: ordered. ? ?Risk ?Prescription drug management. ? ? ?Brian Hawkins is here for evaluation for ongoing chronic knee pain and foot pain and intermittent tremors.  Unremarkable vitals.  No fever.  Overall patient appears very well.  No new falls.  No obvious deformity to his knee or his feet.  Do not believe there is any acute knee or foot process going on.  These are bilateral pains that are chronic and well-documented in past visits to his primary care doctor.  He used to get injections in his knees.  He states that he has not followed up with his primary care doctor and the last note from his primary care doctor is from about 6 months ago.  The issues that he has today with tremors and these pains have been documented in the past.  He is not having any active tremors.  Appears that he has a history of alcohol abuse, some memory loss, chronic pain in his knees.  He does not appear to be withdrawing from alcohol.  He has no obvious tremors.  Neurologically he appears to be intact.  He had blood work drawn and per my review and interpretation there is no significant anemia, electrolyte abnormality, kidney injury or leukocytosis.  He had a CBC, BMP and EKG  drawn.  EKG per my review and interpretation shows sinus rhythm.  No ischemic changes.  Overall there does not appear to be an acute process ongoing.  Recommend strongly that he follow-up with his primary care doctor about these issues.  Discharged in good condition.  He has had narcotic prescriptions in the  past for this pain.  We will write him for a small amount of tramadol so he can follow-up with his primary care doctor about his chronic pain. ? ?This chart was dictated using voice recognition software.  Despite best efforts to proofread,  errors can occur which can change the documentation meaning.  ? ? ? ? ? ? ? ?Final Clinical Impression(s) / ED Diagnoses ?Final diagnoses:  ?Occasional tremors  ?Chronic pain of both knees  ? ? ?Rx / DC Orders ?ED Discharge Orders   ? ? None  ? ?  ? ? ?  ?Virgina Norfolk, DO ?07/27/21 1433 ? ?  ?Virgina Norfolk, DO ?07/27/21 1444 ? ?

## 2021-07-27 NOTE — ED Triage Notes (Signed)
Pt arrives to ED after driving himself states that he feels shaky, reports he has had this in the past but it is becoming more frequent. Pt states he was seen for the same at another hospital about 2 months ago but did not really get any answers at that time. Pt reports pain in knees and feet. Pt reports knee pain is chronic but has gotten worse, same with feet.  ?

## 2021-08-25 ENCOUNTER — Other Ambulatory Visit: Payer: Self-pay

## 2021-08-25 ENCOUNTER — Emergency Department (HOSPITAL_BASED_OUTPATIENT_CLINIC_OR_DEPARTMENT_OTHER)
Admission: EM | Admit: 2021-08-25 | Discharge: 2021-08-25 | Disposition: A | Discharge status: Home / Self Care | Payer: Medicare Other | Attending: Emergency Medicine | Admitting: Emergency Medicine

## 2021-08-25 ENCOUNTER — Encounter (HOSPITAL_BASED_OUTPATIENT_CLINIC_OR_DEPARTMENT_OTHER): Payer: Self-pay | Admitting: Emergency Medicine

## 2021-08-25 DIAGNOSIS — M25562 Pain in left knee: Secondary | ICD-10-CM | POA: Insufficient documentation

## 2021-08-25 DIAGNOSIS — I10 Essential (primary) hypertension: Secondary | ICD-10-CM | POA: Insufficient documentation

## 2021-08-25 DIAGNOSIS — G8929 Other chronic pain: Secondary | ICD-10-CM | POA: Diagnosis not present

## 2021-08-25 DIAGNOSIS — Z79899 Other long term (current) drug therapy: Secondary | ICD-10-CM | POA: Insufficient documentation

## 2021-08-25 DIAGNOSIS — M25561 Pain in right knee: Secondary | ICD-10-CM | POA: Insufficient documentation

## 2021-08-25 HISTORY — DX: Other chronic pain: G89.29

## 2021-08-25 MED ORDER — TRAMADOL HCL 50 MG PO TABS: 50.0000 mg | ORAL_TABLET | Freq: Two times a day (BID) | ORAL | 0 refills | Status: DC | PRN

## 2021-08-25 MED ORDER — AMLODIPINE BESYLATE 10 MG PO TABS: 10.0000 mg | ORAL_TABLET | Freq: Every day | ORAL | 0 refills | Status: DC

## 2021-08-25 MED ORDER — PRAVASTATIN SODIUM 40 MG PO TABS: 40.0000 mg | ORAL_TABLET | Freq: Every day | ORAL | 0 refills | Status: DC

## 2021-08-25 MED ORDER — VALSARTAN 160 MG PO TABS: 160.0000 mg | ORAL_TABLET | Freq: Every day | ORAL | 0 refills | Status: DC

## 2021-08-25 NOTE — ED Triage Notes (Signed)
Pt states his pharmacy changed his medication for his knee pain about 2 weeks ago.  Since then, the knee pain has worsened. ?

## 2021-08-25 NOTE — Discharge Instructions (Signed)
Take medications as prescribed.  New medication refills have been sent. ?

## 2021-08-25 NOTE — ED Provider Notes (Signed)
?[facility] EMERGENCY DEPARTMENT ?Provider Note ? ? ?CSN: [id number] ?Arrival date & time: 08/25/21  1020 ? ?  ? ?History ? ?Chief Complaint  ?Patient presents with  ? Knee Pain  ? ? ?Brian Hawkins is a 71 y.o. male. ? ?Patient here for medication refill.  History of hypertension, high cholesterol, chronic knee pain.  States he has been having some chronic knee pain flare.  Denies any fevers, denies any falls.  Ambulation makes it worse.  States that he is not had his blood pressure medication.  Denies any headache, chest pain, stroke symptoms.  Walking makes it worse.  Rest makes it better.  Has not been able to follow-up with his primary care doctor. ? ? ? ?  ? ?Home Medications ?Prior to Admission medications   ?Medication Sig Start Date End Date Taking? Authorizing Provider  ?amLODipine (NORVASC) 10 MG tablet Take 1 tablet (10 mg total) by mouth daily. 08/25/21 09/24/21 Yes Modesty Rudy, DO  ?pravastatin (PRAVACHOL) 40 MG tablet Take 1 tablet (40 mg total) by mouth daily. 08/25/21 09/24/21 Yes Uthman Mroczkowski, DO  ?traMADol (ULTRAM) 50 MG tablet Take 1 tablet (50 mg total) by mouth every 12 (twelve) hours as needed for up to 10 doses. 08/25/21  Yes Mysti Haley, DO  ?valsartan (DIOVAN) 160 MG tablet Take 1 tablet (160 mg total) by mouth daily. 08/25/21 09/24/21 Yes Khady Vandenberg, DO  ?   ? ?Allergies    ?Gabapentin   ? ?Review of Systems   ?Review of Systems ? ?Physical Exam ?Updated Vital Signs ?BP (!) 132/98   Pulse 96   Temp 99.1 ?F (37.3 ?C) (Oral)   Resp 16   Ht 5\' 11"  (1.803 m)   Wt 113.4 kg   SpO2 96%   BMI 34.87 kg/m?  ?Physical Exam ?Vitals and nursing note reviewed.  ?Constitutional:   ?   General: He is not in acute distress. ?   Appearance: He is well-developed.  ?HENT:  ?   Head: Normocephalic and atraumatic.  ?   Nose: Nose normal.  ?   Mouth/Throat:  ?   Mouth: Mucous membranes are moist.  ?Eyes:  ?   Extraocular Movements: Extraocular movements intact.  ?   Conjunctiva/sclera:  Conjunctivae normal.  ?   Pupils: Pupils are equal, round, and reactive to light.  ?Cardiovascular:  ?   Rate and Rhythm: Normal rate and regular rhythm.  ?   Pulses: Normal pulses.  ?   Heart sounds: Normal heart sounds. No murmur heard. ?Pulmonary:  ?   Effort: Pulmonary effort is normal. No respiratory distress.  ?   Breath sounds: Normal breath sounds.  ?Abdominal:  ?   Palpations: Abdomen is soft.  ?   Tenderness: There is no abdominal tenderness.  ?Musculoskeletal:     ?   General: Swelling present. Normal range of motion.  ?   Cervical back: Neck supple.  ?   Comments: Minimal prepatellar swelling to bilateral knees with some mild tenderness  ?Skin: ?   General: Skin is warm and dry.  ?   Capillary Refill: Capillary refill takes less than 2 seconds.  ?Neurological:  ?   General: No focal deficit present.  ?   Mental Status: He is alert.  ?   Sensory: No sensory deficit.  ?   Motor: No weakness.  ?Psychiatric:     ?   Mood and Affect: Mood normal.  ? ? ?ED Results / Procedures / Treatments   ?Labs ?(all labs  ordered are listed, but only abnormal results are displayed) ?Labs Reviewed - No data to display ? ?EKG ?None ? ?Radiology ?No results found. ? ?Procedures ?Procedures  ? ? ?Medications Ordered in ED ?Medications - No data to display ? ?ED Course/ Medical Decision Making/ A&P ?  ?                        ?Medical Decision Making ?Risk ?Prescription drug management. ? ?Brian Hawkins is here with acute on chronic knee pain.  Medication refill needed.  Normal vitals.  No fever.  States that his knees are swelling up he had the last 2 weeks.  Denies any trauma.  This is a chronic process for him.  He has severe arthritis in his legs specifically his knees.  Neurovascular neuromuscular intact.  Neurologically intact.  Overall very well-appearing.  He states he needs refills on his blood pressure medications.  I have refilled his blood pressure medication.  I will write him for short course of tramadol for  breakthrough pain.  Recommend Tylenol, ibuprofen, ice.  Discharged in good condition.  Recommend follow-up with primary care doctor. ? ?This chart was dictated using voice recognition software.  Despite best efforts to proofread,  errors can occur which can change the documentation meaning.  ? ? ? ? ? ? ? ?Final Clinical Impression(s) / ED Diagnoses ?Final diagnoses:  ?Chronic pain of both knees  ? ? ?Rx / DC Orders ?ED Discharge Orders   ? ?      Ordered  ?  valsartan (DIOVAN) 160 MG tablet  Daily       ? 08/25/21 1052  ?  amLODipine (NORVASC) 10 MG tablet  Daily       ? 08/25/21 1052  ?  traMADol (ULTRAM) 50 MG tablet  Every 12 hours PRN       ? 08/25/21 1052  ?  pravastatin (PRAVACHOL) 40 MG tablet  Daily       ? 08/25/21 1053  ? ?  ?  ? ?  ? ? ?  ?Lennice Sites, DO ?08/25/21 1055 ? ?

## 2021-09-04 ENCOUNTER — Emergency Department (HOSPITAL_BASED_OUTPATIENT_CLINIC_OR_DEPARTMENT_OTHER): Payer: Medicare Other

## 2021-09-04 ENCOUNTER — Encounter (HOSPITAL_BASED_OUTPATIENT_CLINIC_OR_DEPARTMENT_OTHER): Payer: Self-pay | Admitting: Emergency Medicine

## 2021-09-04 ENCOUNTER — Other Ambulatory Visit: Payer: Self-pay

## 2021-09-04 ENCOUNTER — Emergency Department (HOSPITAL_BASED_OUTPATIENT_CLINIC_OR_DEPARTMENT_OTHER)
Admission: EM | Admit: 2021-09-04 | Discharge: 2021-09-04 | Disposition: A | Discharge status: Home / Self Care | Payer: Medicare Other | Attending: Emergency Medicine | Admitting: Emergency Medicine

## 2021-09-04 DIAGNOSIS — Z79899 Other long term (current) drug therapy: Secondary | ICD-10-CM | POA: Insufficient documentation

## 2021-09-04 DIAGNOSIS — M170 Bilateral primary osteoarthritis of knee: Secondary | ICD-10-CM | POA: Insufficient documentation

## 2021-09-04 DIAGNOSIS — I10 Essential (primary) hypertension: Secondary | ICD-10-CM | POA: Diagnosis not present

## 2021-09-04 DIAGNOSIS — G8929 Other chronic pain: Secondary | ICD-10-CM

## 2021-09-04 MED ORDER — TRAMADOL HCL 50 MG PO TABS
50.0000 mg | ORAL_TABLET | Freq: Two times a day (BID) | ORAL | 0 refills | Status: DC | PRN
  Filled 2021-09-04: qty 10, 5d supply, fill #0

## 2021-09-04 NOTE — ED Notes (Signed)
Pt verbalized understanding of d/c instructions, prescription and follow up care. Pt wheeled out of ED via wheelchair.  °

## 2021-09-04 NOTE — Discharge Instructions (Signed)
The case manager will contact you to help set up home health or talk about nursing home placement.  Take the anti-inflammatory as prescribed and follow-up with your doctor.  Return to the ED with new or worsening symptoms

## 2021-09-04 NOTE — ED Provider Notes (Signed)
[facility] EMERGENCY DEPARTMENT Provider Note   CSN: [id number] Arrival date & time: 09/04/21  1719     History  Chief Complaint  Patient presents with   Knee Pain    Brian Hawkins is a 71 y.o. male.  Patient with a history of hypertension, hyperlipidemia, chronic knee pain secondary to arthritis here with bilateral knee pain.  States has been having this pain for years and he is frustrated with it.  He denies any fall or recent injury.  He states he ibuprofen at home without relief.  He was given tramadol in the ED last month and said it helped minimally. He has never seen an orthopedic surgeon and does not want to have knee replacement surgery.  He is interested in going to an assisted living facility or nursing home.  He currently lives by himself and drove himself here. He denies any fevers, chills, nausea, vomiting, chest pain or shortness of breath.  Denies any back pain.  Denies any recent fall or trauma to his knees.  The history is provided by the patient.  Knee Pain Associated symptoms: no fever       Home Medications Prior to Admission medications   Medication Sig Start Date End Date Taking? Authorizing Provider  amLODipine (NORVASC) 10 MG tablet Take 1 tablet (10 mg total) by mouth daily. 08/25/21 09/24/21  Curatolo, Adam, DO  pravastatin (PRAVACHOL) 40 MG tablet Take 1 tablet (40 mg total) by mouth daily. 08/25/21 09/24/21  Curatolo, Adam, DO  traMADol (ULTRAM) 50 MG tablet Take 1 tablet (50 mg total) by mouth every 12 (twelve) hours as needed for up to 10 doses. 08/25/21   Curatolo, Adam, DO  valsartan (DIOVAN) 160 MG tablet Take 1 tablet (160 mg total) by mouth daily. 08/25/21 09/24/21  Virgina Norfolkuratolo, Adam, DO      Allergies    Gabapentin    Review of Systems   Review of Systems  Constitutional:  Negative for activity change, appetite change and fever.  HENT:  Negative for congestion and rhinorrhea.   Respiratory:  Negative for cough, chest tightness and  shortness of breath.   Gastrointestinal:  Negative for abdominal pain, nausea and vomiting.  Musculoskeletal:  Positive for arthralgias, gait problem, joint swelling and myalgias.  Skin:  Negative for rash.  Neurological:  Negative for dizziness, weakness and light-headedness.   all other systems are negative except as noted in the HPI and PMH.   Physical Exam Updated Vital Signs BP 121/77 (BP Location: Right Arm)   Pulse 87   Temp 97.8 F (36.6 C) (Oral)   Resp 18   SpO2 97%  Physical Exam Vitals and nursing note reviewed.  Constitutional:      General: He is not in acute distress.    Appearance: He is well-developed. He is obese.  HENT:     Head: Normocephalic and atraumatic.     Mouth/Throat:     Pharynx: No oropharyngeal exudate.  Eyes:     Conjunctiva/sclera: Conjunctivae normal.     Pupils: Pupils are equal, round, and reactive to light.  Neck:     Comments: No meningismus. Cardiovascular:     Rate and Rhythm: Normal rate and regular rhythm.     Heart sounds: Normal heart sounds. No murmur heard. Pulmonary:     Effort: Pulmonary effort is normal. No respiratory distress.     Breath sounds: Normal breath sounds.  Chest:     Chest wall: No tenderness.  Abdominal:     Palpations:  Abdomen is soft.     Tenderness: There is no abdominal tenderness. There is no guarding or rebound.  Musculoskeletal:        General: Swelling and tenderness present.     Cervical back: Normal range of motion and neck supple.     Comments: Small prepatellar effusions bilaterally.  Range of motion reduced due to pain.  No significant warmth, erythema or effusion to knees bilaterally.  Intact DP and PT pulses  Skin:    General: Skin is warm.  Neurological:     Mental Status: He is alert and oriented to person, place, and time.     Cranial Nerves: No cranial nerve deficit.     Motor: No abnormal muscle tone.     Coordination: Coordination normal.     Comments:  5/5 strength throughout. CN  2-12 intact.Equal grip strength.   Psychiatric:        Behavior: Behavior normal.    ED Results / Procedures / Treatments   Labs (all labs ordered are listed, but only abnormal results are displayed) Labs Reviewed - No data to display  EKG None  Radiology DG Knee Complete 4 Views Left  Result Date: 09/04/2021 CLINICAL DATA:  Chronic bilateral knee pain. EXAM: LEFT KNEE - COMPLETE 4+ VIEW COMPARISON:  Bilateral knee radiographs 12/24/2020 FINDINGS: Severe medial compartment joint space narrowing with moderate peripheral degenerative osteophytes. Moderate medial tibial plateau subchondral sclerosis. Mild chronic concavity of the medial tibial plateau, unchanged. Mild peripheral lateral compartment degenerative osteophytosis. Moderate peripheral patellar degenerative osteophytosis. No joint effusion. Multiple small likely loose bodies within the suprapatellar joint space, unchanged. Small well corticated ossicle medial to the medial femoral condyle. No acute fracture or dislocation. 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 Electronically Signed   By: Neita Garnet M.D.   On: 09/04/2021 18:02   DG Knee Complete 4 Views Right  Result Date: 09/04/2021 CLINICAL DATA:  Chronic bilateral knee pain. EXAM: RIGHT KNEE - COMPLETE 4+ VIEW COMPARISON:  Bilateral knee radiographs 12/24/2020 FINDINGS: Severe medial joint space narrowing and bone-on-bone contact. Mild-to-moderate downsloping of the medial aspect of the medial tibial plateau, chronic and unchanged from prior. Moderate to large peripheral medial compartment degenerative osteophytes. Mild peripheral lateral compartment degenerative osteophytosis. Severe patellofemoral joint space narrowing and peripheral osteophytosis. No acute fracture or dislocation.  No joint effusion. IMPRESSION: Severe medial compartment and patellofemoral compartment osteoarthritis. Electronically Signed   By: Neita Garnet M.D.   On:  09/04/2021 18:04    Procedures Procedures    Medications Ordered in ED Medications - No data to display  ED Course/ Medical Decision Making/ A&P                           Medical Decision Making Amount and/or Complexity of Data Reviewed Labs: ordered. Decision-making details documented in ED Course. Radiology: ordered and independent interpretation performed. Decision-making details documented in ED Course. ECG/medicine tests: ordered and independent interpretation performed. Decision-making details documented in ED Course.  Risk Prescription drug management.  Patient with acute on chronic knee pain.  Denies any new trauma.  Denies any fever.  He is requesting help with getting nursing home. He has declined surgery in the past.   Vitals are stable, no distress.  Neurovascular intact.  X-rays obtained in triage and reviewed by me show no fractures or dislocations. Patient able to range his knees.  Does have severe arthritis on x-ray.  No warmth erythema or fever.  Low suspicion for septic joint  Case manager consult placed for patient to discuss home health versus nursing home possibilities.  Patient stable for discharge home.  No evidence of septic joint.  Will be contacted by case manager tomorrow for further evaluation and home health versus placement.  Return precautions discussed        Final Clinical Impression(s) / ED Diagnoses Final diagnoses:  Chronic pain of both knees    Rx / DC Orders ED Discharge Orders     None         Tylee Yum, Brian Senior, Brian Hawkins 09/04/21 2302

## 2021-09-04 NOTE — ED Triage Notes (Signed)
Pt POV from home c/o chronic BL knee pain. Pain is intermittent. Worsens with movement. Last dose of Aspirin 2-3 hours ago.   Also requesting information for assisted living facility.

## 2021-09-05 ENCOUNTER — Telehealth (HOSPITAL_BASED_OUTPATIENT_CLINIC_OR_DEPARTMENT_OTHER): Payer: Self-pay | Admitting: Emergency Medicine

## 2021-09-05 MED ORDER — TRAMADOL HCL 50 MG PO TABS: 50.0000 mg | ORAL_TABLET | Freq: Two times a day (BID) | ORAL | 0 refills | Status: AC | PRN

## 2021-09-05 NOTE — Telephone Encounter (Signed)
Patient by accident came here today to pick up his medication from the pharmacy.  The pharmacy here is closed.  We will send his prescription to his pharmacy of choice.

## 2021-09-08 ENCOUNTER — Other Ambulatory Visit (HOSPITAL_BASED_OUTPATIENT_CLINIC_OR_DEPARTMENT_OTHER): Payer: Self-pay

## 2021-09-11 ENCOUNTER — Other Ambulatory Visit: Payer: Self-pay

## 2021-09-11 ENCOUNTER — Emergency Department (HOSPITAL_BASED_OUTPATIENT_CLINIC_OR_DEPARTMENT_OTHER)
Admission: EM | Admit: 2021-09-11 | Discharge: 2021-09-11 | Disposition: A | Discharge status: Home / Self Care | Payer: Medicare Other | Attending: Emergency Medicine | Admitting: Emergency Medicine

## 2021-09-11 ENCOUNTER — Other Ambulatory Visit (HOSPITAL_BASED_OUTPATIENT_CLINIC_OR_DEPARTMENT_OTHER): Payer: Self-pay

## 2021-09-11 ENCOUNTER — Encounter (HOSPITAL_BASED_OUTPATIENT_CLINIC_OR_DEPARTMENT_OTHER): Payer: Self-pay | Admitting: Emergency Medicine

## 2021-09-11 DIAGNOSIS — Z79899 Other long term (current) drug therapy: Secondary | ICD-10-CM | POA: Diagnosis not present

## 2021-09-11 DIAGNOSIS — Z760 Encounter for issue of repeat prescription: Secondary | ICD-10-CM | POA: Insufficient documentation

## 2021-09-11 DIAGNOSIS — M25569 Pain in unspecified knee: Secondary | ICD-10-CM | POA: Insufficient documentation

## 2021-09-11 DIAGNOSIS — I10 Essential (primary) hypertension: Secondary | ICD-10-CM | POA: Insufficient documentation

## 2021-09-11 DIAGNOSIS — M7989 Other specified soft tissue disorders: Secondary | ICD-10-CM | POA: Insufficient documentation

## 2021-09-11 MED ORDER — AMLODIPINE BESYLATE 10 MG PO TABS
10.0000 mg | ORAL_TABLET | Freq: Every day | ORAL | 0 refills | Status: AC
  Filled 2021-09-11 (×2): qty 30, 30d supply, fill #0

## 2021-09-11 MED ORDER — VALSARTAN 160 MG PO TABS
160.0000 mg | ORAL_TABLET | Freq: Every day | ORAL | 0 refills | Status: AC
  Filled 2021-09-11 (×2): qty 30, 30d supply, fill #0

## 2021-09-11 MED ORDER — PRAVASTATIN SODIUM 40 MG PO TABS
40.0000 mg | ORAL_TABLET | Freq: Every day | ORAL | 0 refills | Status: AC
  Filled 2021-09-11 (×2): qty 30, 30d supply, fill #0

## 2021-09-11 NOTE — ED Provider Notes (Signed)
[facility] EMERGENCY DEPARTMENT Provider Note   CSN: [id number] Arrival date & time: 09/11/21  1038     History  Chief Complaint  Patient presents with   Leg Swelling    Brian Hawkins is a 71 y.o. male.  Patient here for medication refill.  Does not have a primary care doctor anymore.  Needs blood pressure and cholesterol medication filled.  Denies any chest pain, shortness of breath, abdominal pain, nausea, vomiting.  Has come here in the past for refills.  The history is provided by the patient.      Home Medications Prior to Admission medications   Medication Sig Start Date End Date Taking? Authorizing Provider  amLODipine (NORVASC) 10 MG tablet Take 1 tablet (10 mg total) by mouth daily. 09/11/21 10/11/21  Brian Hawkins  pravastatin (PRAVACHOL) 40 MG tablet Take 1 tablet (40 mg total) by mouth daily. 09/11/21 10/11/21  Brian Hawkins  traMADol (ULTRAM) 50 MG tablet Take 1 tablet (50 mg total) by mouth every 12 (twelve) hours as needed for up to 10 doses. 09/05/21   Melene Plan, Hawkins  valsartan (DIOVAN) 160 MG tablet Take 1 tablet (160 mg total) by mouth daily. 09/11/21 10/11/21  Brian Hawkins      Allergies    Gabapentin    Review of Systems   Review of Systems  Physical Exam Updated Vital Signs BP 140/74   Pulse 75   Temp 98.6 F (37 C) (Oral)   Resp 17   Ht 5\' 10"  (1.778 m)   Wt 104.3 kg   SpO2 97%   BMI 33.00 kg/m  Physical Exam Constitutional:      General: He is not in acute distress.    Appearance: He is not ill-appearing.  HENT:     Head: Normocephalic.     Nose: Nose normal.     Mouth/Throat:     Mouth: Mucous membranes are moist.  Eyes:     Pupils: Pupils are equal, round, and reactive to light.  Neurological:     General: No focal deficit present.     Mental Status: He is alert.    ED Results / Procedures / Treatments   Labs (all labs ordered are listed, but only abnormal results are displayed) Labs Reviewed - No data to  display  EKG None  Radiology No results found.  Procedures Procedures    Medications Ordered in ED Medications - No data to display  ED Course/ Medical Decision Making/ A&P                           Medical Decision Making Risk Prescription drug management.   Brian Hawkins is here for medication refill.  History of high cholesterol, hypertension.  Patient I have seen before.  He does not have a primary care doctor.  Last time I saw him he asked for refills of his amlodipine, pravastatin and valsartan.  He was also having some chronic knee pain.  He has been given tramadol in the past intermittently for this.  He is not having any significant discomfort at this time and will hold off on refilling any other narcotic pain medicine.  I have prescribed his home medications.  I have given him information to follow-up with a new primary care doctor.  Overall he has no physical complaints.  Discharged in good condition.  Vital signs are normal.  This chart was dictated using voice recognition software.  Despite best  efforts to proofread,  errors can occur which can change the documentation meaning.         Final Clinical Impression(s) / ED Diagnoses Final diagnoses:  Medication refill    Rx / DC Orders ED Discharge Orders          Ordered    amLODipine (NORVASC) 10 MG tablet  Daily        09/11/21 1151    pravastatin (PRAVACHOL) 40 MG tablet  Daily        09/11/21 1151    valsartan (DIOVAN) 160 MG tablet  Daily        09/11/21 1151              Brian Hawkins, Madelaine Bhat, Hawkins 09/11/21 1153

## 2021-09-11 NOTE — ED Triage Notes (Signed)
Patient presents to ED via POV from home. Patient reports chronic leg swelling x 1 year. Also requesting a medication refill. No PCP.

## 2021-09-11 NOTE — Discharge Instructions (Signed)
Please call Dr. Drue Novel office to set up primary care.  I have given you refills on your medications.  At this time I would hold off on taking your tramadol and will not refill this medication at this time.

## 2021-09-12 ENCOUNTER — Encounter (HOSPITAL_COMMUNITY): Payer: Self-pay | Admitting: Emergency Medicine

## 2021-09-12 ENCOUNTER — Ambulatory Visit (HOSPITAL_COMMUNITY)
Admission: EM | Admit: 2021-09-12 | Discharge: 2021-09-12 | Disposition: A | Discharge status: Home / Self Care | Payer: Medicare Other | Attending: Emergency Medicine | Admitting: Emergency Medicine

## 2021-09-12 DIAGNOSIS — R600 Localized edema: Secondary | ICD-10-CM | POA: Diagnosis not present

## 2021-09-12 NOTE — ED Triage Notes (Signed)
Patient reports bilateral lower leg edema beginning 2-3 days prior with lower leg pain. Denies chest pain, cough. Reports mild SOB with walking. Was seen in ED yesterday, given prescription for pravastatin, valsartan, and norvasc without improvement.

## 2021-09-12 NOTE — Discharge Instructions (Addendum)
Please go to the [facility] Emergency Department tomorrow at 11 AM. You need to go to the registration desk and tell them you are there for an ultrasound. They will perform the exam in the hospital.

## 2021-09-12 NOTE — ED Provider Notes (Signed)
[facility]    CSN: [id number] Arrival date & time: 09/12/21  1610     History   Chief Complaint Chief Complaint  Patient presents with   Leg Swelling    HPI Brian Hawkins is a 71 y.o. male.  Presents with pain and swelling in bilateral legs since 3 days ago.  He was seen in the emergency department yesterday and mentioned his leg swelling however there is no mention of this in the ED note.  Only had his medicines refilled yesterday.  He has noticed most of the swelling in his left leg and ankle. Pain with walking. Denies fever, chills, chest pain or tightness, shortness of breath, abdominal pain, vomiting/diarrhea.  No history of heart failure.  Does have history of hypertension for which he takes medicine. Quit smoking over 30 years ago.  No recent immobilization, surgery, long car or plane rides.  No history of blood clot.  Past Medical History:  Diagnosis Date   Allergic rhinitis    Arthritis    Chicken pox    Chronic knee pain    Gout    History of kidney stones    Hypertension    Obesity     Patient Active Problem List   Diagnosis Date Noted   Preventative health care 05/20/2015   Primary osteoarthritis of knee 05/20/2015   Bilateral knee pain 05/20/2015   H/O renal calculi 06/20/2014   Obesity (BMI 30.0-34.9) 06/20/2014   Allergic rhinitis, seasonal 06/20/2014   Essential hypertension 03/20/2012   Gout 03/20/2012    Past Surgical History:  Procedure Laterality Date   CARPAL TUNNEL RELEASE     Left       Home Medications    Prior to Admission medications   Medication Sig Start Date End Date Taking? Authorizing Provider  amLODipine (NORVASC) 10 MG tablet Take 1 tablet (10 mg total) by mouth daily. 09/11/21 10/11/21  Curatolo, Adam, DO  pravastatin (PRAVACHOL) 40 MG tablet Take 1 tablet (40 mg total) by mouth daily. 09/11/21 10/11/21  Curatolo, Adam, DO  traMADol (ULTRAM) 50 MG tablet Take 1 tablet (50 mg total) by mouth every 12 (twelve) hours as  needed for up to 10 doses. 09/05/21   Melene Plan, DO  valsartan (DIOVAN) 160 MG tablet Take 1 tablet (160 mg total) by mouth daily. 09/11/21 10/11/21  Virgina Norfolk, DO    Family History Family History  Problem Relation Age of Onset   Hypertension Mother    Hypertension Father    Arthritis Father    Hypertension Sister    Hypertension Sister    Hypertension Sister    Hypertension Brother    Hypertension Brother    Hypertension Maternal Grandmother    Hypertension Maternal Grandfather    Hypertension Paternal Grandmother    Hypertension Paternal Grandfather    Hypertension Maternal Aunt    Hypertension Maternal Uncle    Hypertension Paternal Aunt    Hypertension Paternal Uncle     Social History Social History   Tobacco Use   Smoking status: Former    Types: Cigarettes    Quit date: 05/19/2006    Years since quitting: 15.3   Smokeless tobacco: Never  Vaping Use   Vaping Use: Never used  Substance Use Topics   Alcohol use: Yes    Alcohol/week: 15.0 standard drinks    Types: 15 Glasses of wine per week    Comment: 2 glass of wine /day   Drug use: No     Allergies  Gabapentin   Review of Systems Review of Systems  Per HPI  Physical Exam Triage Vital Signs ED Triage Vitals  Enc Vitals Group     BP 09/12/21 1624 129/62     Pulse Rate 09/12/21 1624 86     Resp 09/12/21 1624 16     Temp 09/12/21 1624 99.1 F (37.3 C)     Temp Source 09/12/21 1624 Oral     SpO2 09/12/21 1624 95 %     Weight --      Height --      Head Circumference --      Peak Flow --      Pain Score 09/12/21 1625 9     Pain Loc --      Pain Edu? --      Excl. in GC? --    No data found.  Updated Vital Signs BP 129/62 (BP Location: Right Arm)   Pulse 86   Temp 99.1 F (37.3 C) (Oral)   Resp 16   SpO2 95%     Physical Exam Vitals and nursing note reviewed.  Constitutional:      General: He is not in acute distress. Cardiovascular:     Rate and Rhythm: Normal rate and regular  rhythm.     Pulses: Normal pulses.     Heart sounds: Normal heart sounds.    No gallop. No S3 or S4 sounds.  Pulmonary:     Effort: Pulmonary effort is normal.     Breath sounds: Normal breath sounds and air entry. No rhonchi or rales.  Abdominal:     General: Bowel sounds are normal.     Tenderness: There is no abdominal tenderness.  Musculoskeletal:        General: Swelling present.     Right lower leg: Tenderness present. 2+ Pitting Edema present.     Left lower leg: Swelling and tenderness present. 3+ Pitting Edema present.     Right ankle: Swelling present. Tenderness present.     Left ankle: Swelling present. Tenderness present.     Right foot: Swelling and tenderness present.     Left foot: Swelling and tenderness present.     Comments: Left ankle with moderate swelling, right ankle mild swelling. Bilat pitting edema up to knees. Dec ROM due to pain. DP and PT pulses intact bilat. Sensation intact. No ischemic changes of skin, cap refil < 2 seconds bilat  Neurological:     Mental Status: He is alert.    UC Treatments / Results  Labs (all labs ordered are listed, but only abnormal results are displayed) Labs Reviewed - No data to display  EKG  Radiology No results found.  Procedures Procedures (including critical care time)  Medications Ordered in UC Medications - No data to display  Initial Impression / Assessment and Plan / UC Course  I have reviewed the triage vital signs and the nursing notes.  Pertinent labs & imaging results that were available during my care of the patient were reviewed by me and considered in my medical decision making (see chart for details).   Blood pressure is normal in clinic, no shortness of breath or chest pain.  At this time I recommend patient go to the emergency department for outpatient ultrasound.  I have sent in venous Doppler order for tomorrow morning at [facility] - location per patient request.  He understands that  if he cannot have the imaging done at this location [facility] is to [facility].  I recommend he use Tylenol for pain.  He can elevate the legs to relieve swelling.  Patient agrees to plan, return precautions were discussed and the patient is discharged in stable condition.  Final Clinical Impressions(s) / UC Diagnoses   Final diagnoses:  Bilateral lower extremity edema     Discharge Instructions      [facility] tomorrow at 11 AM. You need to go to the registration desk and tell them you are there for an ultrasound. They will perform the exam in the hospital.     ED Prescriptions   None    PDMP not reviewed this encounter.   Tomislav Micale, Lurena JoinerRebecca, [place]-C 09/12/21 1725

## 2024-04-20 IMAGING — DX DG KNEE COMPLETE 4+V*R*
4 series · 4 of 4 positions shown · non-contrast
Comparison: Bilateral knee radiographs 12/24/2020

CLINICAL DATA: Chronic bilateral knee pain.

EXAM:
RIGHT KNEE - COMPLETE 4+ VIEW

[knee ap]
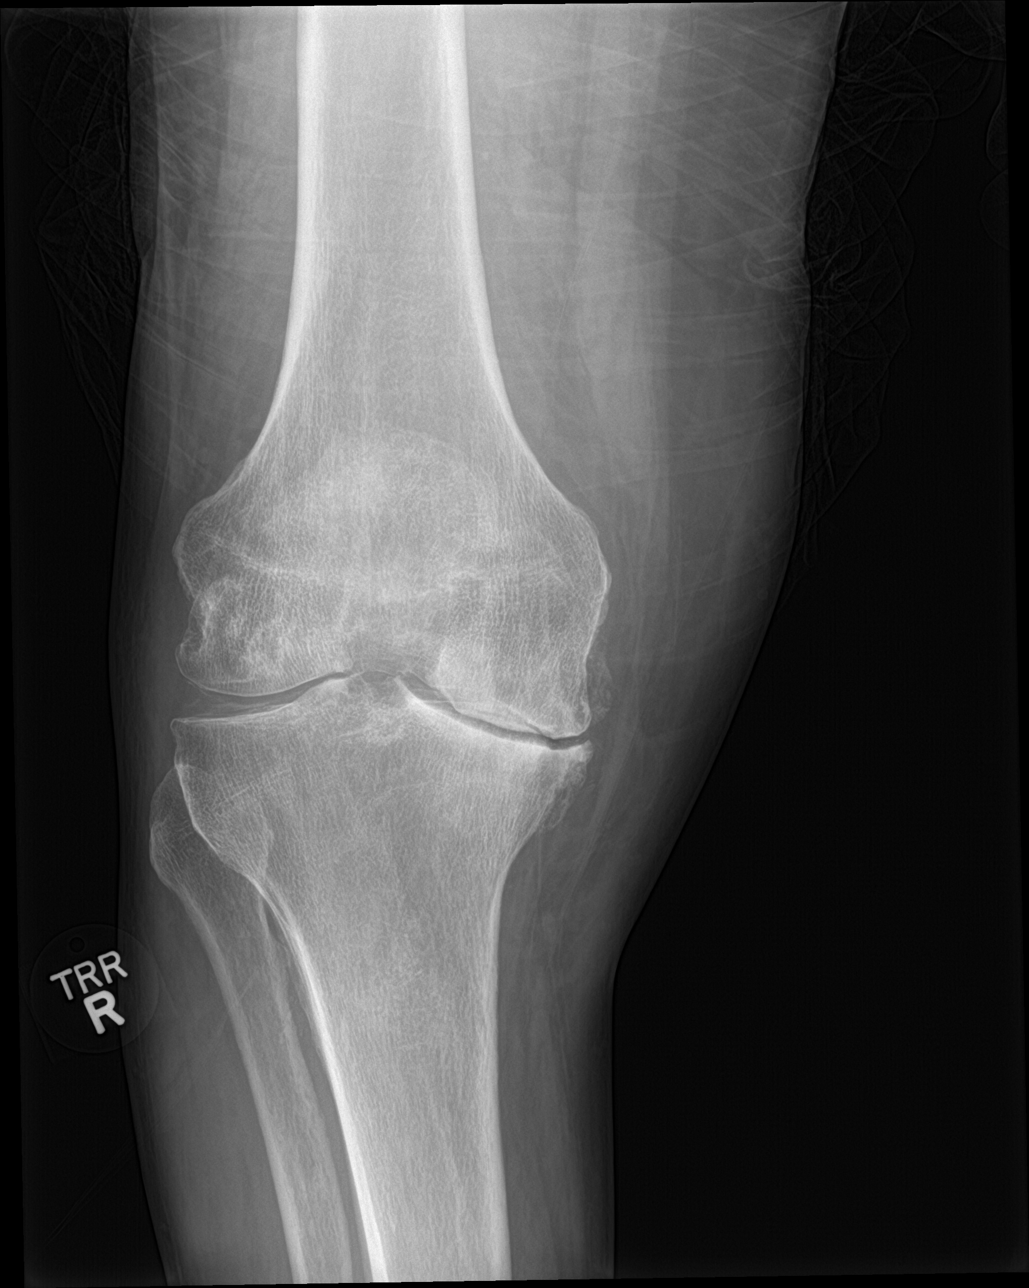

[knee lat]
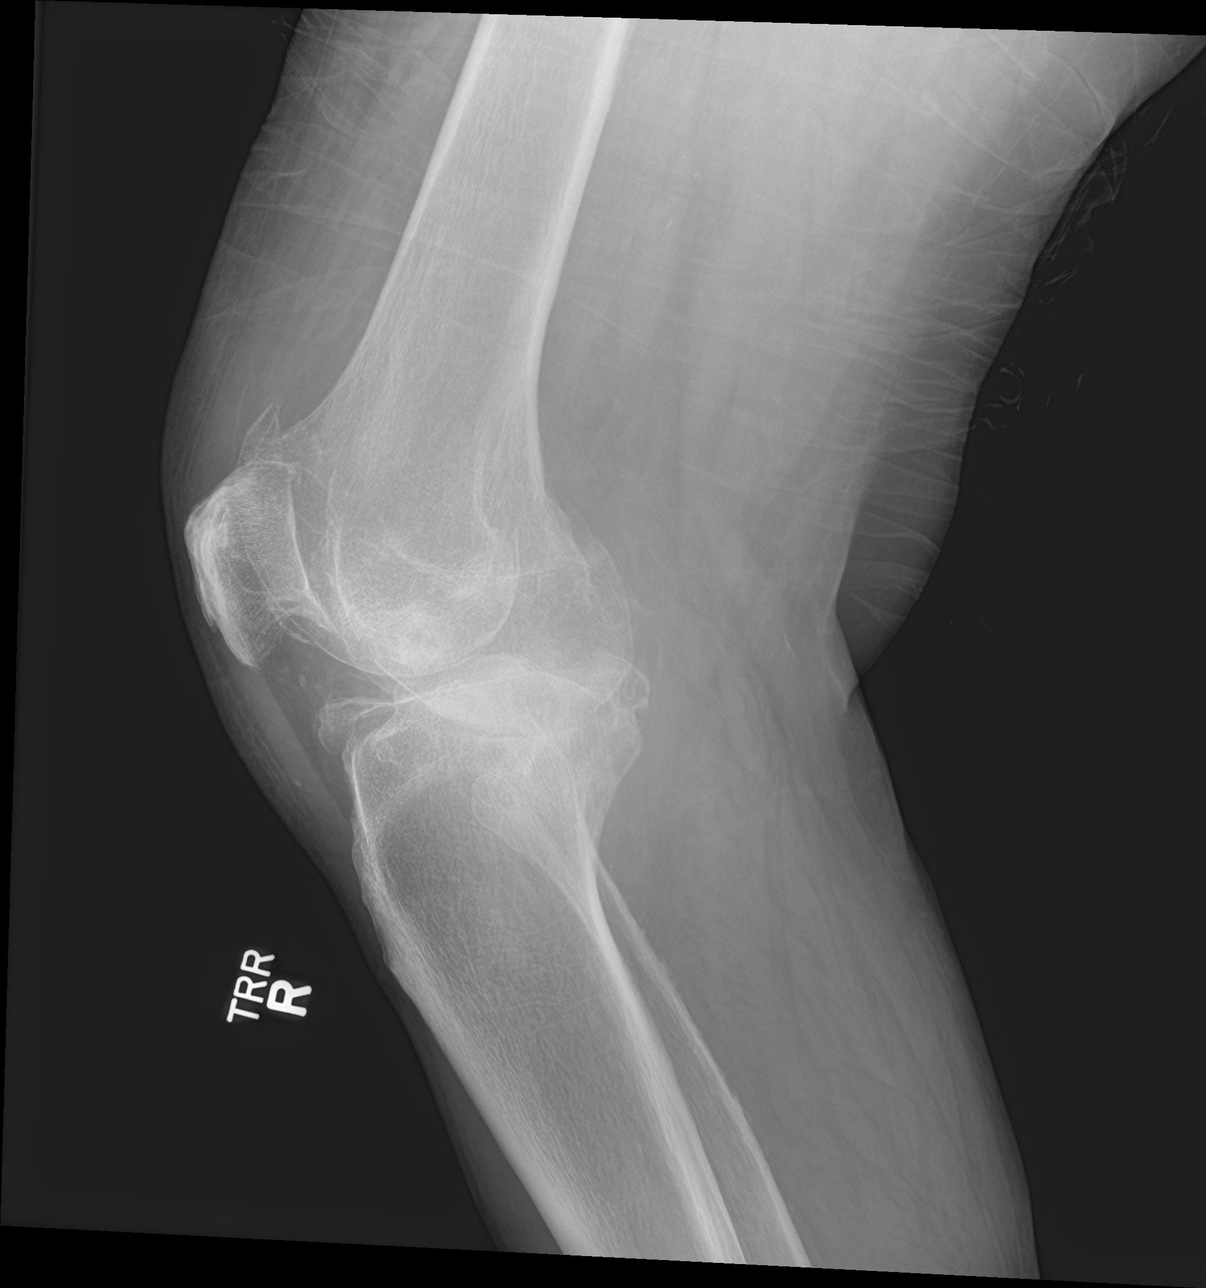

[knee obl (1 of 2)]
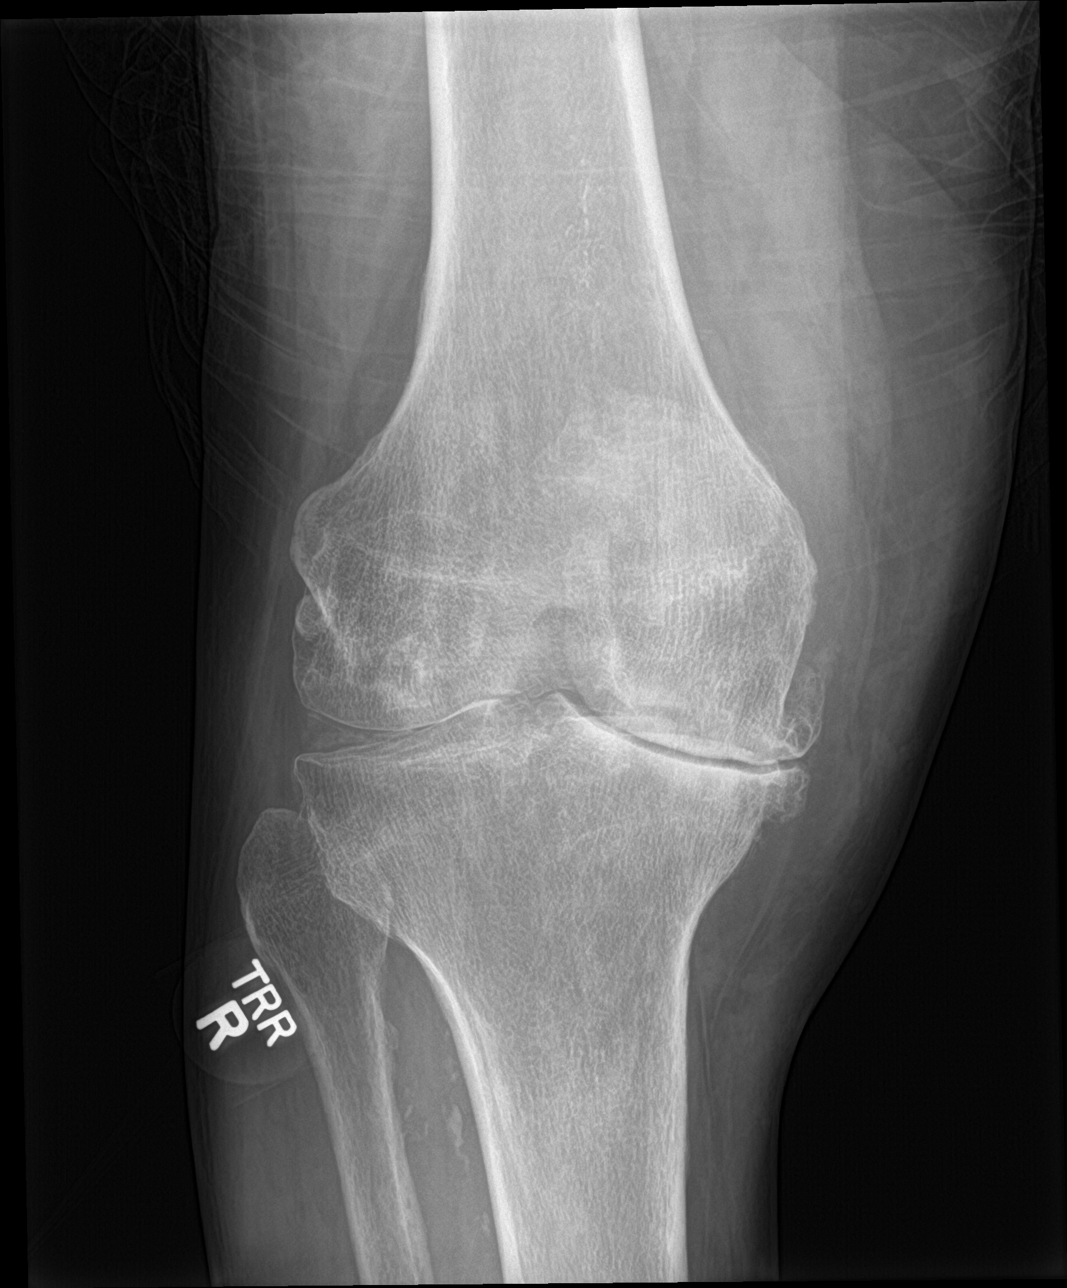

[knee obl (2 of 2)]
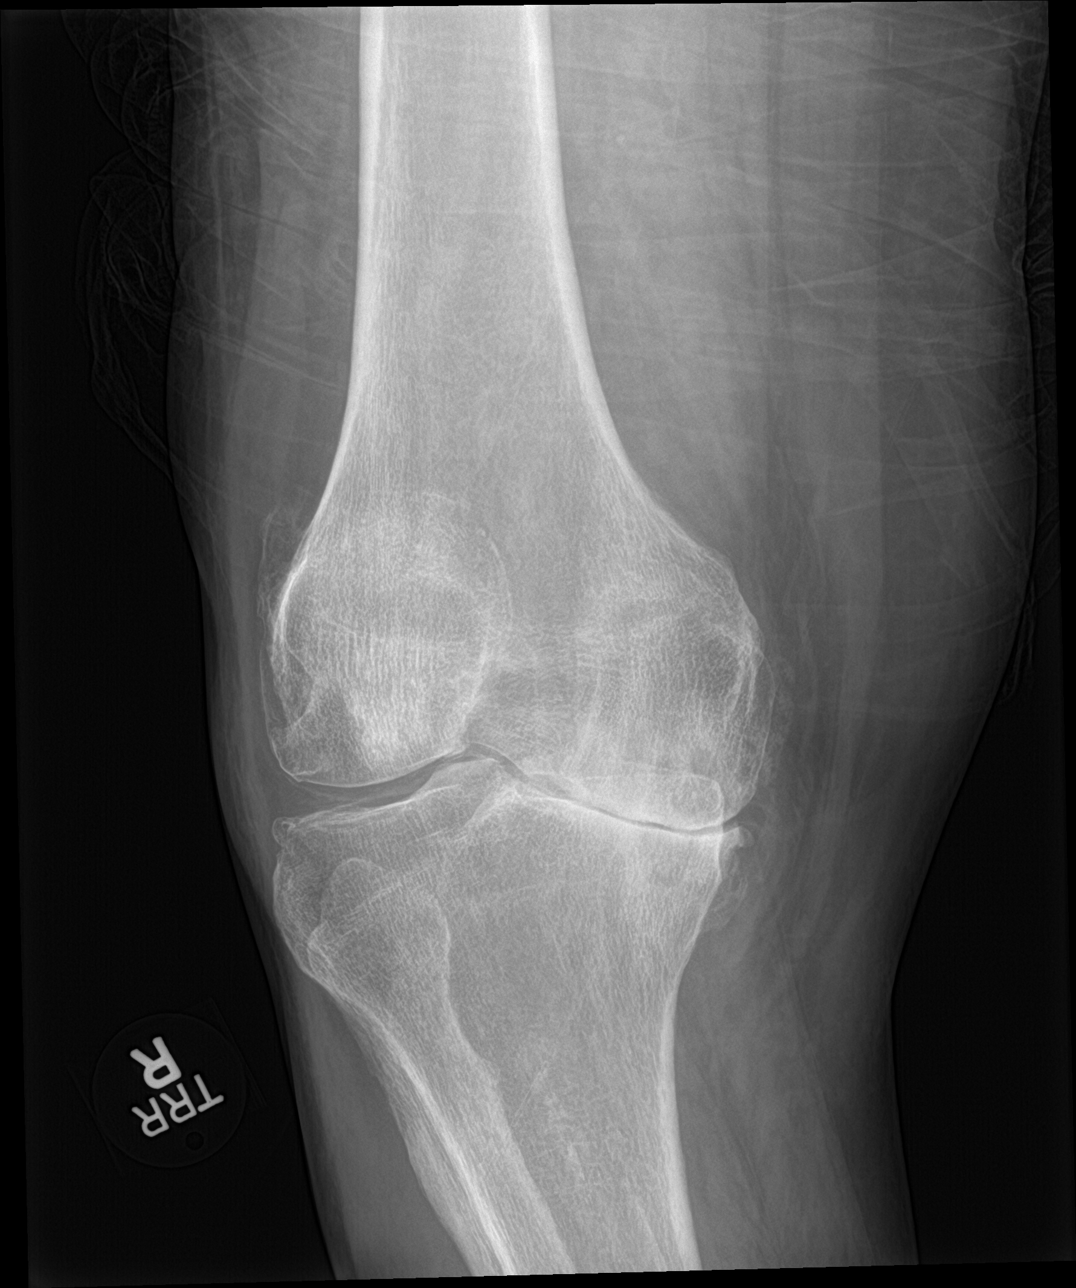

[4 of 4 positions shown; findings below may reference images not displayed]

FINDINGS: Severe medial joint space narrowing and bone-on-bone contact.
Mild-to-moderate downsloping of the medial aspect of the medial
tibial plateau, chronic and unchanged from prior. Moderate to large
peripheral medial compartment degenerative osteophytes.

Mild peripheral lateral compartment degenerative osteophytosis.
Severe patellofemoral joint space narrowing and peripheral
osteophytosis.

No acute fracture or dislocation.  No joint effusion.
IMPRESSION: Severe medial compartment and patellofemoral compartment
osteoarthritis.
